# Patient Record
Sex: Female | Born: 1950 | Race: Black or African American | Hispanic: No | Marital: Married | Smoking: Former smoker
Health system: Southern US, Community
[De-identification: ages and names within clinical notes are randomized; demographics above are authoritative.]

## PROBLEM LIST (undated history)

## (undated) DIAGNOSIS — M1990 Unspecified osteoarthritis, unspecified site: Secondary | ICD-10-CM

## (undated) DIAGNOSIS — Z9889 Other specified postprocedural states: Secondary | ICD-10-CM

## (undated) DIAGNOSIS — K219 Gastro-esophageal reflux disease without esophagitis: Secondary | ICD-10-CM

## (undated) DIAGNOSIS — F419 Anxiety disorder, unspecified: Secondary | ICD-10-CM

## (undated) DIAGNOSIS — Z803 Family history of malignant neoplasm of breast: Secondary | ICD-10-CM

## (undated) DIAGNOSIS — E7800 Pure hypercholesterolemia, unspecified: Secondary | ICD-10-CM

## (undated) DIAGNOSIS — I10 Essential (primary) hypertension: Secondary | ICD-10-CM

## (undated) DIAGNOSIS — I719 Aortic aneurysm of unspecified site, without rupture: Secondary | ICD-10-CM

## (undated) HISTORY — PX: ABDOMINAL HYSTERECTOMY: SHX81

## (undated) HISTORY — DX: Family history of malignant neoplasm of breast: Z80.3

## (undated) HISTORY — PX: TONSILLECTOMY: SUR1361

## (undated) HISTORY — DX: Other specified postprocedural states: Z98.890

---

## 2010-10-07 ENCOUNTER — Ambulatory Visit (HOSPITAL_COMMUNITY)
Admission: RE | Admit: 2010-10-07 | Discharge: 2010-10-07 | Payer: Self-pay | Source: Home / Self Care | Attending: Nurse Practitioner | Admitting: Nurse Practitioner

## 2011-03-23 ENCOUNTER — Emergency Department (HOSPITAL_COMMUNITY)
Admission: EM | Admit: 2011-03-23 | Discharge: 2011-03-23 | Disposition: A | Discharge status: Home / Self Care | Payer: Federal, State, Local not specified - PPO | Attending: Emergency Medicine | Admitting: Emergency Medicine

## 2011-03-23 ENCOUNTER — Encounter: Payer: Self-pay | Admitting: *Deleted

## 2011-03-23 DIAGNOSIS — I10 Essential (primary) hypertension: Secondary | ICD-10-CM | POA: Insufficient documentation

## 2011-03-23 DIAGNOSIS — M62838 Other muscle spasm: Secondary | ICD-10-CM | POA: Insufficient documentation

## 2011-03-23 HISTORY — DX: Gastro-esophageal reflux disease without esophagitis: K21.9

## 2011-03-23 HISTORY — DX: Essential (primary) hypertension: I10

## 2011-03-23 LAB — BASIC METABOLIC PANEL
CO2: 25 mEq/L (ref 19–32)
Calcium: 10 mg/dL (ref 8.4–10.5)
Creatinine, Ser: 0.73 mg/dL (ref 0.50–1.10)
GFR calc non Af Amer: >60 mL/min (ref >60)

## 2011-03-23 LAB — PROTIME-INR
INR: 1.08 (ref 0.00–1.49)
Prothrombin Time: 14.2 seconds (ref 11.6–15.2)

## 2011-03-23 LAB — CBC
MCV: 85.1 fL (ref 78.0–100.0)
Platelets: 254 10*3/uL (ref 150–400)
RDW: 13.8 % (ref 11.5–15.5)
WBC: 8.5 10*3/uL (ref 4.0–10.5)

## 2011-03-23 LAB — APTT: aPTT: 30 seconds (ref 24–37)

## 2011-03-23 LAB — D-DIMER, QUANTITATIVE: D-Dimer, Quant: 0.54 ug/mL-FEU — ABNORMAL HIGH (ref 0.00–0.48)

## 2011-03-23 MED ORDER — CYCLOBENZAPRINE HCL 10 MG PO TABS: 10.0000 mg | ORAL_TABLET | Freq: Once | ORAL | Status: AC

## 2011-03-23 MED ORDER — IBUPROFEN 800 MG PO TABS: 800.0000 mg | ORAL_TABLET | Freq: Three times a day (TID) | ORAL | Status: AC

## 2011-03-23 MED ORDER — CYCLOBENZAPRINE HCL 10 MG PO TABS
10.0000 mg | ORAL_TABLET | Freq: Once | ORAL | Status: AC
  Administered 2011-03-23: 10 mg via ORAL
  Filled 2011-03-23: qty 1

## 2011-03-23 MED ORDER — KETOROLAC TROMETHAMINE 60 MG/2ML IM SOLN
60.0000 mg | Freq: Once | INTRAMUSCULAR | Status: AC
  Administered 2011-03-23: 60 mg via INTRAMUSCULAR
  Filled 2011-03-23: qty 2

## 2011-03-23 NOTE — ED Provider Notes (Signed)
History     Chief Complaint  Patient presents with  . Leg Pain   HPI Comments: Pt with hx of RLE pain that started mildly 3 days ago and has been persistent but not worrisome - today she was driving her from [facility] and noted that she developed worsening pain in the R calf which is constant, worse with ambulation and walking on the leg, constant, severe and not associated with swelling.  She has no RF for DVT other than short travel today but she ha been getting out of the car every hour to walk.  No hx of DVT, no smoking, no hormone use, no FHx of DVT.  Denies trauma to leg.  No sob, fever, nausea, cp or other c/o.  The history is provided by the patient and a relative.    Past Medical History  Diagnosis Date  . Hypertension   . GERD (gastroesophageal reflux disease)     Past Surgical History  Procedure Date  . Abdominal hysterectomy   . Tonsillectomy     Family History  Problem Relation Age of Onset  . Cancer Mother     History  Substance Use Topics  . Smoking status: Former Games developer  . Smokeless tobacco: Not on file  . Alcohol Use: No    OB History    Grav Para Term Preterm Abortions TAB SAB Ect Mult Living                  Review of Systems  Constitutional: Negative for fever and chills.  HENT: Negative for sore throat and neck pain.   Eyes: Negative for visual disturbance.  Respiratory: Negative for cough and shortness of breath.   Cardiovascular: Negative for chest pain.  Gastrointestinal: Negative for nausea, vomiting, abdominal pain and diarrhea.  Genitourinary: Negative for dysuria and frequency.  Musculoskeletal: Negative for back pain.       Pain in RLE  Skin: Negative for rash.  Neurological: Negative for weakness, numbness and headaches.  Hematological: Negative for adenopathy.  Psychiatric/Behavioral: Negative for behavioral problems.    Physical Exam  BP 154/91  Pulse 82  Temp(Src) 97.8 F (36.6 C) (Oral)  Resp 20  Ht 5\' 10"  (1.778 m)  Wt  208 lb (94.348 kg)  BMI 29.84 kg/m2  SpO2 98%  Physical Exam  Constitutional: She appears well-developed and well-nourished. No distress.  HENT:  Head: Normocephalic and atraumatic.  Mouth/Throat: Oropharynx is clear and moist. No oropharyngeal exudate.  Eyes: Conjunctivae and EOM are normal. Pupils are equal, round, and reactive to light. Right eye exhibits no discharge. Left eye exhibits no discharge. No scleral icterus.  Neck: Normal range of motion. Neck supple. No JVD present. No thyromegaly present.  Cardiovascular: Normal rate, regular rhythm, normal heart sounds and intact distal pulses.  Exam reveals no gallop and no friction rub.   No murmur heard. Pulmonary/Chest: Effort normal and breath sounds normal.  Abdominal: Soft. There is no tenderness.  Musculoskeletal: Normal range of motion. She exhibits tenderness ( ttp int he posterior calf on the R, pain with dorsiflexion of the R foot, no edema of the legs. normal pulses at the feet bil). She exhibits no edema.  Lymphadenopathy:    She has no cervical adenopathy.  Neurological: She is alert. Coordination normal.  Skin: Skin is warm and dry. No rash noted. She is not diaphoretic. No erythema.  Psychiatric: She has a normal mood and affect. Her behavior is normal.    ED Course  Procedures  MDM  Has pain in the calf, r/o dvt, spasm, low K  K is 3.4, d dimer barely elevated - will have come back in AM for [place].  Well appearing at this time overall.    Pain is improving with meds, scheduled [place] for the AM, will withhold treatment for DVT until [place] result done in AM.  Vida Roller, MD 03/23/11 2014

## 2011-03-23 NOTE — ED Notes (Signed)
MD at bedside. Dr. Miller at bedside.  

## 2011-03-23 NOTE — ED Notes (Signed)
Patient tearful during assessment.  A&O; skin w/d. Respirations even and unlabored; able to speak in complete sentences without difficulty.  Unable to assess gait.

## 2011-03-23 NOTE — ED Notes (Signed)
Pt c/o severe pain in her right calf. States that it has been a little achy for 2-3 days but has gotten extremely bad over the last hour. Hurts to put weight on it. No edema or redness noted.

## 2011-03-24 ENCOUNTER — Ambulatory Visit (HOSPITAL_COMMUNITY)
Admit: 2011-03-24 | Discharge: 2011-03-24 | Disposition: A | Discharge status: Home / Self Care | Payer: Federal, State, Local not specified - PPO | Source: Ambulatory Visit | Attending: Emergency Medicine | Admitting: Emergency Medicine

## 2011-03-24 DIAGNOSIS — M79609 Pain in unspecified limb: Secondary | ICD-10-CM | POA: Insufficient documentation

## 2011-03-24 DIAGNOSIS — M7989 Other specified soft tissue disorders: Secondary | ICD-10-CM | POA: Insufficient documentation

## 2015-06-06 ENCOUNTER — Encounter (HOSPITAL_COMMUNITY): Payer: Self-pay | Admitting: *Deleted

## 2015-06-06 ENCOUNTER — Emergency Department (HOSPITAL_COMMUNITY)
Admission: EM | Admit: 2015-06-06 | Discharge: 2015-06-06 | Disposition: A | Discharge status: Home / Self Care | Payer: Federal, State, Local not specified - PPO | Attending: Emergency Medicine | Admitting: Emergency Medicine

## 2015-06-06 DIAGNOSIS — Z79899 Other long term (current) drug therapy: Secondary | ICD-10-CM | POA: Diagnosis not present

## 2015-06-06 DIAGNOSIS — R21 Rash and other nonspecific skin eruption: Secondary | ICD-10-CM | POA: Diagnosis present

## 2015-06-06 DIAGNOSIS — I10 Essential (primary) hypertension: Secondary | ICD-10-CM | POA: Diagnosis not present

## 2015-06-06 DIAGNOSIS — K219 Gastro-esophageal reflux disease without esophagitis: Secondary | ICD-10-CM | POA: Insufficient documentation

## 2015-06-06 DIAGNOSIS — B029 Zoster without complications: Secondary | ICD-10-CM | POA: Diagnosis not present

## 2015-06-06 DIAGNOSIS — Z87891 Personal history of nicotine dependence: Secondary | ICD-10-CM | POA: Diagnosis not present

## 2015-06-06 MED ORDER — VALACYCLOVIR HCL 1 G PO TABS: 1000.0000 mg | ORAL_TABLET | Freq: Three times a day (TID) | ORAL | Status: AC

## 2015-06-06 MED ORDER — HYDROXYZINE HCL 25 MG PO TABS: 25.0000 mg | ORAL_TABLET | Freq: Four times a day (QID) | ORAL | Status: DC

## 2015-06-06 MED ORDER — HYDROCODONE-ACETAMINOPHEN 5-325 MG PO TABS: 2.0000 | ORAL_TABLET | ORAL | Status: DC | PRN

## 2015-06-06 NOTE — ED Notes (Signed)
Rash to left breast area first noticed yesterday evening. Pt first noticed a "prickling pain" to the area.

## 2015-06-06 NOTE — ED Provider Notes (Signed)
CSN: [id number]     Arrival date & time 06/06/15  6644 History   First MD Initiated Contact with Patient 06/06/15 1014     Chief Complaint  Patient presents with  . Rash     (Consider location/radiation/quality/duration/timing/severity/associated sxs/prior Treatment) Patient is a 64 y.o. female presenting with rash. The history is provided by the patient. No language interpreter was used.  Rash Location:  Torso Torso rash location:  L chest Quality: blistering and itchiness   Severity:  Moderate Onset quality:  Gradual Duration:  1 day Timing:  Constant Progression:  Worsening Chronicity:  New Relieved by:  Nothing Worsened by:  Nothing tried Ineffective treatments:  None tried Associated symptoms: no fever, no joint pain and no nausea     Past Medical History  Diagnosis Date  . Hypertension   . GERD (gastroesophageal reflux disease)    Past Surgical History  Procedure Laterality Date  . Abdominal hysterectomy    . Tonsillectomy     Family History  Problem Relation Age of Onset  . Cancer Mother    Social History  Substance Use Topics  . Smoking status: Former Games developer  . Smokeless tobacco: None  . Alcohol Use: No   OB History    No data available     Review of Systems  Constitutional: Negative for fever.  Gastrointestinal: Negative for nausea.  Musculoskeletal: Negative for arthralgias.  Skin: Positive for rash.  All other systems reviewed and are negative.     Allergies  Review of patient's allergies indicates no known allergies.  Home Medications   Prior to Admission medications   Medication Sig Start Date End Date Taking? Authorizing Provider  losartan-hydrochlorothiazide (HYZAAR) 50-12.5 MG per tablet Take 0.5 tablets by mouth daily.      Historical Provider, MD  ranitidine (ZANTAC) 150 MG capsule Take 150 mg by mouth as needed.      Historical Provider, MD   BP 152/88 mmHg  Pulse 70  Temp(Src) 97.8 F (36.6 C) (Oral)  Resp 16  Ht   (1.753 m)  Wt 208 lb (94.348 kg)  BMI 30.70 kg/m2  SpO2 97% Physical Exam  Constitutional: She is oriented to person, place, and time. She appears well-developed and well-nourished.  HENT:  Head: Normocephalic and atraumatic.  Right Ear: External ear normal.  Nose: Nose normal.  Mouth/Throat: Oropharynx is clear and moist.  Eyes: Conjunctivae and EOM are normal. Pupils are equal, round, and reactive to light.  Neck: Normal range of motion.  Cardiovascular: Normal rate and normal heart sounds.   Pulmonary/Chest: Effort normal.  Abdominal: Soft. She exhibits no distension.  Musculoskeletal: Normal range of motion.  Neurological: She is alert and oriented to person, place, and time.  Skin: Rash noted.  Erythematous raised pimples,   Left side only,  May be early shingles  Psychiatric: She has a normal mood and affect.  Nursing note and vitals reviewed.   ED Course  Procedures (including critical care time) Labs Review Labs Reviewed - No data to display  Imaging Review No results found. I have personally reviewed and evaluated these images and lab results as part of my medical decision-making.   EKG Interpretation None      MDM   Final diagnoses:  Shingles    Valtrex Atarax Hydrocodone Return if any problems.   Lonia Skinner [place], PA-C 06/06/15 1043  Zadie Rhine, MD 06/06/15 [phone]

## 2015-06-06 NOTE — Discharge Instructions (Signed)

## 2015-12-29 ENCOUNTER — Ambulatory Visit: Payer: Federal, State, Local not specified - PPO | Admitting: Orthopaedic Surgery

## 2015-12-30 ENCOUNTER — Encounter: Payer: Self-pay | Admitting: Orthopaedic Surgery

## 2015-12-30 ENCOUNTER — Ambulatory Visit (INDEPENDENT_AMBULATORY_CARE_PROVIDER_SITE_OTHER): Payer: Medicare Other | Admitting: Orthopaedic Surgery

## 2015-12-30 VITALS — BP 133/86 | HR 79 | Temp 97.5°F | Resp 16 | Ht 69.0 in | Wt 208.0 lb

## 2015-12-30 DIAGNOSIS — M79672 Pain in left foot: Secondary | ICD-10-CM | POA: Diagnosis not present

## 2015-12-30 DIAGNOSIS — M25561 Pain in right knee: Secondary | ICD-10-CM | POA: Diagnosis not present

## 2015-12-30 NOTE — Patient Instructions (Signed)
Continue to be active, use ibuprofen, and ice

## 2015-12-30 NOTE — Progress Notes (Signed)
Subjective: My right knee is popping but it does not hurt, my left heel hurts some.    Patient ID: Mariah Holt, female    DOB: 05-29-1951, 65 y.o.   MRN: [id number]  Knee Pain  The incident occurred more than 1 week ago. There was no injury mechanism. The pain is present in the right knee. The pain is at a severity of 0/10. The patient is experiencing no pain. Pertinent negatives include no inability to bear weight, loss of motion, loss of sensation, muscle weakness, numbness or tingling. Nothing aggravates the symptoms. She has tried nothing for the symptoms.  Foot Pain This is a chronic problem. The problem has been gradually improving. Associated symptoms include arthralgias. Pertinent negatives include no chest pain, congestion, coughing or numbness. The symptoms are aggravated by walking. She has tried NSAIDs for the symptoms. The treatment provided significant relief.   She has had left heel pain on and off for several months.  She has been doing stretching exercises on a regular basis.  It has really helped.  It is not hurting today.  She has had some popping of the right knee recently.  She has no pain, no swelling, no redness, no locking, no trauma.  She is concerned about the popping.  She goes to the gym three days a week and is very active.  She has no other joint pains.   Review of Systems  HENT: Negative for congestion.   Respiratory: Negative for cough and shortness of breath.   Cardiovascular: Negative for chest pain and leg swelling.  Endocrine: Negative for cold intolerance.  Musculoskeletal: Positive for arthralgias.  Allergic/Immunologic: Negative for environmental allergies.  Neurological: Negative for tingling and numbness.  All other systems reviewed and are negative.  Past Medical History  Diagnosis Date  . Hypertension   . GERD (gastroesophageal reflux disease)     Past Surgical History  Procedure Laterality Date  . Abdominal hysterectomy    .  Tonsillectomy      Current Outpatient Prescriptions on File Prior to Visit  Medication Sig Dispense Refill  . losartan-hydrochlorothiazide (HYZAAR) 50-12.5 MG per tablet Take 0.5 tablets by mouth daily.       No current facility-administered medications on file prior to visit.    Social History   Social History  . Marital Status: Divorced    Spouse Name: N/A  . Number of Children: N/A  . Years of Education: N/A   Occupational History  . Not on file.   Social History Main Topics  . Smoking status: Former Games developermoker  . Smokeless tobacco: Not on file  . Alcohol Use: No  . Drug Use: No  . Sexual Activity: Not on file   Other Topics Concern  . Not on file   Social History Narrative    BP 133/86 mmHg  Pulse 79  Temp(Src) 97.5 F (36.4 C)  Resp 16  Ht 5\' 9"  (1.753 m)  Wt 208 lb (94.348 kg)  BMI 30.70 kg/m2      Objective:   Physical Exam  Constitutional: She is oriented to person, place, and time. She appears well-developed and well-nourished.  HENT:  Head: Normocephalic and atraumatic.  Eyes: Conjunctivae and EOM are normal. Pupils are equal, round, and reactive to light.  Neck: Normal range of motion. Neck supple.  Cardiovascular: Normal rate, regular rhythm and intact distal pulses.   Pulmonary/Chest: Effort normal.  Abdominal: Soft.  Musculoskeletal: Normal range of motion.  Neurological: She is alert and oriented to person,  place, and time. She displays normal reflexes. No cranial nerve deficit. She exhibits normal muscle tone. Coordination normal.  Skin: Skin is warm and dry.  Psychiatric: She has a normal mood and affect. Her behavior is normal. Judgment and thought content normal.   Exam is normal      Assessment & Plan:  She has history of left heel pain not hurting now.  She has history of right knee crepitus, no pain.  I have explained findings and I have told her that just having popping with no pain of the knee is not a major worry.  She may have  early signs of arthritis.  She is to continue her activities.  I will see her as needed.

## 2018-05-25 ENCOUNTER — Emergency Department (HOSPITAL_COMMUNITY)
Admission: EM | Admit: 2018-05-25 | Discharge: 2018-05-25 | Disposition: A | Discharge status: Home / Self Care | Payer: Medicare Other | Attending: Emergency Medicine | Admitting: Emergency Medicine

## 2018-05-25 ENCOUNTER — Encounter (HOSPITAL_COMMUNITY): Payer: Self-pay | Admitting: Emergency Medicine

## 2018-05-25 ENCOUNTER — Other Ambulatory Visit: Payer: Self-pay

## 2018-05-25 DIAGNOSIS — E7800 Pure hypercholesterolemia, unspecified: Secondary | ICD-10-CM | POA: Insufficient documentation

## 2018-05-25 DIAGNOSIS — I10 Essential (primary) hypertension: Secondary | ICD-10-CM

## 2018-05-25 DIAGNOSIS — R42 Dizziness and giddiness: Secondary | ICD-10-CM

## 2018-05-25 DIAGNOSIS — Z87891 Personal history of nicotine dependence: Secondary | ICD-10-CM | POA: Diagnosis not present

## 2018-05-25 DIAGNOSIS — Z79899 Other long term (current) drug therapy: Secondary | ICD-10-CM | POA: Insufficient documentation

## 2018-05-25 HISTORY — DX: Pure hypercholesterolemia, unspecified: E78.00

## 2018-05-25 LAB — I-STAT CHEM 8, ED
BUN: 12 mg/dL (ref 8–23)
CREATININE: 0.7 mg/dL (ref 0.44–1.00)
Calcium, Ion: 1.16 mmol/L (ref 1.15–1.40)
Chloride: 105 mmol/L (ref 98–111)
Glucose, Bld: 101 mg/dL — ABNORMAL HIGH (ref 70–99)
HEMATOCRIT: 41 % (ref 36.0–46.0)
HEMOGLOBIN: 13.9 g/dL (ref 12.0–15.0)
POTASSIUM: 3.8 mmol/L (ref 3.5–5.1)
SODIUM: 139 mmol/L (ref 135–145)
TCO2: 23 mmol/L (ref 22–32)

## 2018-05-25 NOTE — Discharge Instructions (Signed)
Your testing today has been reassuring, your blood work was normal, your EKG was normal showing no signs of heart problems, your blood pressure has been slightly elevated compared to your normal.  Please take an extra dose of your home blood pressure medication today at this time, tomorrow resume your normal daily medication dosing.  Please take your blood pressure daily at the same time every day, 2 hours after you take your blood pressure medication, keep a record of this and share this with your family doctor.  Drink plenty of fluids and rest today staying out of the heat and do not exert yourself for the rest of the day.  Return to the emergency department for severe or worsening symptoms including chest pain, headache, blurred vision, weakness or numbness.

## 2018-05-25 NOTE — ED Provider Notes (Signed)
[facility] EMERGENCY DEPARTMENT Provider Note   CSN: [id number] Arrival date & time: 05/25/18  1054     History   Chief Complaint Chief Complaint  Patient presents with  . Hypertension    HPI Bess KindsFrancine Mcerlean is a 67 y.o. female.  HPI  The patient is a 67 year old female, she has a known history of acid reflux, high cholesterol, hypertension for which she takes an angiotensin receptor blocker as well and has a diuretic hydrochlorothiazide.  She reports that she was in her usual state of health until a couple weeks ago when she developed a gastrointestinal illness with lots of diarrhea, shortly thereafter she was scheduled for colonoscopy and did the colonoscopy prep further having more diarrhea, colonoscopy was unremarkable with a single polyp but no complications, endoscopy at the same time was also reassuring, she then went back to the gym and started exercising.  For the last couple of days she has had a decreased appetite but has been taking both food and water, diarrhea has resolved and she has been doing well.  Today she was outside gardening, bending over picking weeds and putting out mulch, she saw her husband coming across the yard and stood up quickly to say hello when at that time she became lightheaded feeling like she was going to pass out.  This came on abruptly with her change in position and resolved after less than 1 minute.  She went inside and took her blood pressure which was elevated at 158/100, she took it several times and it remained elevated which raise concern for her.  She has never had high blood pressure like that in the past.  She denies fevers chills coughing shortness of breath chest pain headache blurred vision numbness or weakness at this time.  States that she is in her usual state of health at this time.  Past Medical History:  Diagnosis Date  . GERD (gastroesophageal reflux disease)   . High cholesterol   . Hypertension     There are no active  problems to display for this patient.   Past Surgical History:  Procedure Laterality Date  . ABDOMINAL HYSTERECTOMY    . TONSILLECTOMY       OB History   None      Home Medications    Prior to Admission medications   Medication Sig Start Date End Date Taking? Authorizing Provider  losartan-hydrochlorothiazide (HYZAAR) 50-12.5 MG per tablet Take 0.5 tablets by mouth daily.      [provider]  omeprazole (PRILOSEC) 10 MG capsule Take 10 mg by mouth daily.    [provider]  Vitamin D, Ergocalciferol, (DRISDOL) 50000 units CAPS capsule Take 50,000 Units by mouth every 7 (seven) days.    [provider]    Family History Family History  Problem Relation Age of Onset  . Cancer Mother     Social History Social History   Tobacco Use  . Smoking status: Former Games developermoker  . Smokeless tobacco: Never Used  Substance Use Topics  . Alcohol use: Yes    Comment: rarely  . Drug use: No     Allergies   Patient has no known allergies.   Review of Systems Review of Systems  All other systems reviewed and are negative.    Physical Exam Updated Vital Signs BP (!) 162/84 (BP Location: Right Arm)   Pulse 70   Temp 98.6 F (37 C) (Oral)   Resp 16   Ht 1.753 m (5\' 9" )  Wt 95.3 kg   SpO2 95%   BMI 31.01 kg/m   Physical Exam  Constitutional: She appears well-developed and well-nourished. No distress.  HENT:  Head: Normocephalic and atraumatic.  Mouth/Throat: Oropharynx is clear and moist. No oropharyngeal exudate.  Eyes: Pupils are equal, round, and reactive to light. Conjunctivae and EOM are normal. Right eye exhibits no discharge. Left eye exhibits no discharge. No scleral icterus.  Neck: Normal range of motion. Neck supple. No JVD present. No thyromegaly present.  Cardiovascular: Normal rate, regular rhythm, normal heart sounds and intact distal pulses. Exam reveals no gallop and no friction rub.  No murmur heard. Pulmonary/Chest: Effort  normal and breath sounds normal. No respiratory distress. She has no wheezes. She has no rales.  Abdominal: Soft. Bowel sounds are normal. She exhibits no distension and no mass. There is no tenderness.  Musculoskeletal: Normal range of motion. She exhibits no edema or tenderness.  Lymphadenopathy:    She has no cervical adenopathy.  Neurological: She is alert. Coordination normal.  Normal speech, coordination, gait, strength in all 4 extremities, no facial droop, the patient does have a stutter which she states is normal for her  Skin: Skin is warm and dry. No rash noted. No erythema.  Psychiatric: She has a normal mood and affect. Her behavior is normal.  Nursing note and vitals reviewed.    ED Treatments / Results  Labs (all labs ordered are listed, but only abnormal results are displayed) Labs Reviewed  I-STAT CHEM 8, ED - Abnormal; Notable for the following components:      Result Value   Glucose, Bld 101 (*)    All other components within normal limits    EKG EKG Interpretation  Date/Time:  Friday May 25 2018 12:21:16 EDT Ventricular Rate:  64 PR Interval:    QRS Duration: 94 QT Interval:  411 QTC Calculation: 424 R Axis:   21 Text Interpretation:  Sinus rhythm Baseline wander in lead(s) V5 ECG OTHERWISE WITHIN NORMAL LIMITS No old tracing to compare Confirmed by Eber Hong ([id number]) on 05/25/2018 12:24:10 PM   Radiology No results found.  Procedures Procedures (including critical care time)  Medications Ordered in ED Medications - No data to display   Initial Impression / Assessment and Plan / ED Course  I have reviewed the triage vital signs and the nursing notes.  Pertinent labs & imaging results that were available during my care of the patient were reviewed by me and considered in my medical decision making (see chart for details).  Clinical Course as of May 25 1321  Fri May 25, 2018  1320 Electrolytes, renal function, potassium are all normal.   Blood pressure rechecked is 162/84   [BM]    Clinical Course User Index [BM] Eber Hong, MD    Obtain EKG, i-STAT chemistry to evaluate potassium, urinalysis at the bedside is very clear without any signs of dehydration or hyper concentration.  Patient is well-appearing, likely orthostatic changes from relative dehydration.  Pt agreeable to d/c Labs normal BP slightly elevated -  Will record daily BP and share with PCP  Final Clinical Impressions(s) / ED Diagnoses   Final diagnoses:  Essential hypertension  Light headed    ED Discharge Orders    None       Eber Hong, MD 05/25/18 1322

## 2018-05-25 NOTE — ED Triage Notes (Signed)
Patient states she was bent over this morning and straightened up, then felt lightheaded. States she took her blood pressure and it was 158/100. Patient denies any complaints at this time.

## 2019-03-27 NOTE — Patient Instructions (Signed)
I value your feedback and entrusting us with your care. If you get a [facility] patient survey, I would appreciate you taking the time to let us know about your experience today. Thank you! 

## 2019-03-27 NOTE — Progress Notes (Signed)
System, Provider Not In   Chief Complaint  Patient presents with  . Ovarian Pain    left side only, pain runs to lower back for the past 2 days    HPI:      [name] is a 68 y.o. No obstetric history on file. who LMP was No LMP recorded. Patient has had a hysterectomy., presents today for NP eval of LLQ pain for the past 2 days, radiating to low back. Pain was really bad when it started--sharp, achy, constant, 8 out of 10 on pain scale. Pt couldn't sleep or get comfortable. Sx slightly better today at a 5 out of 10. No aggrav/allev factors. Tried tylenol without relief. No recent hip injury, back injury, trauma. Did hurt to cross left leg over to right leg but better today. No hx of ovar issues. S/p TAH 27 yrs ago for leio/DUB. No GI sx, vag sx, PMB. No fevers, urin sx. Noted a pelvic "sensation" about 2 wks ago with PCP and had neg UA. No sx until pain started 2 days ago.  FH breast cancer in her mom.    Past Medical History:  Diagnosis Date  . Family history of breast cancer   . GERD (gastroesophageal reflux disease)   . H/O benign breast biopsy   . High cholesterol   . Hypertension     Past Surgical History:  Procedure Laterality Date  . ABDOMINAL HYSTERECTOMY     due to leio/DUB  . TONSILLECTOMY      Family History  Problem Relation Age of Onset  . Breast cancer Mother 31    Social History   Socioeconomic History  . Marital status: Divorced    Spouse name: Not on file  . Number of children: Not on file  . Years of education: Not on file  . Highest education level: Not on file  Occupational History  . Not on file  Social Needs  . Financial resource strain: Not on file  . Food insecurity    Worry: Not on file    Inability: Not on file  . Transportation needs    Medical: Not on file    Non-medical: Not on file  Tobacco Use  . Smoking status: Former Research scientist (life sciences)  . Smokeless tobacco: Never Used  Substance and Sexual Activity  . Alcohol use: Yes    Comment: rarely  . Drug use: No  . Sexual activity: Yes    Birth control/protection: Surgical    Comment: Hysterectomy  Lifestyle  . Physical activity    Days per week: Not on file    Minutes per session: Not on file  . Stress: Not on file  Relationships  . Social Herbalist on phone: Not on file    Gets together: Not on file    Attends religious service: Not on file    Active member of club or organization: Not on file    Attends meetings of clubs or organizations: Not on file    Relationship status: Not on file  . Intimate partner violence    Fear of current or ex partner: Not on file    Emotionally abused: Not on file    Physically abused: Not on file    Forced sexual activity: Not on file  Other Topics Concern  . Not on file  Social History Narrative  . Not on file    Outpatient Medications Prior to Visit  Medication Sig Dispense Refill  . ALPRAZolam (XANAX) 0.25  MG tablet TK 1 T PO QD PRN    . hydrochlorothiazide (HYDRODIURIL) 12.5 MG tablet TK 1 T PO  QD ALONG WITH LOSARTAN    . losartan (COZAAR) 50 MG tablet TK 1 T PO QD ALONG WITH HYDROCHLOROTHIAZIDE    . losartan-hydrochlorothiazide (HYZAAR) 50-12.5 MG per tablet Take 0.5 tablets by mouth daily.      . pravastatin (PRAVACHOL) 20 MG tablet TK 1/2 T PO BID    . Vitamin D, Ergocalciferol, (DRISDOL) 50000 units CAPS capsule Take 50,000 Units by mouth every 7 (seven) days.    Marland Kitchen. omeprazole (PRILOSEC) 10 MG capsule Take 10 mg by mouth daily.     No facility-administered medications prior to visit.       ROS:  Review of Systems  Constitutional: Negative for fatigue, fever and unexpected weight change.  Respiratory: Negative for cough, shortness of breath and wheezing.   Cardiovascular: Negative for chest pain, palpitations and leg swelling.  Gastrointestinal: Negative for blood in stool, constipation, diarrhea, nausea and vomiting.  Endocrine: Negative for cold intolerance, heat intolerance and polyuria.   Genitourinary: Positive for pelvic pain. Negative for dyspareunia, dysuria, flank pain, frequency, genital sores, hematuria, menstrual problem, urgency, vaginal bleeding, vaginal discharge and vaginal pain.  Musculoskeletal: Positive for back pain. Negative for joint swelling and myalgias.  Skin: Negative for rash.  Neurological: Negative for dizziness, syncope, light-headedness, numbness and headaches.  Hematological: Negative for adenopathy.  Psychiatric/Behavioral: Negative for agitation, confusion, sleep disturbance and suicidal ideas. The patient is not nervous/anxious.     OBJECTIVE:   Vitals:  BP (!) 142/92   Ht 5\' 9"  (1.753 m)   Wt 219 lb (99.3 kg)   BMI 32.34 kg/m   Physical Exam Vitals signs reviewed.  Constitutional:      Appearance: She is well-developed.  Neck:     Musculoskeletal: Normal range of motion.  Pulmonary:     Effort: Pulmonary effort is normal.  Abdominal:     Palpations: Abdomen is soft.     Tenderness: There is no abdominal tenderness.    Genitourinary:    General: Normal vulva.     Pubic Area: No rash.      Labia:        Right: No rash, tenderness or lesion.        Left: No rash, tenderness or lesion.      Vagina: Normal. No vaginal discharge, erythema or tenderness.     Uterus: Absent. Not tender.      Adnexa: Right adnexa normal and left adnexa normal.       Right: No mass or tenderness.         Left: No mass or tenderness.    Musculoskeletal: Normal range of motion.  Skin:    General: Skin is warm and dry.  Neurological:     General: No focal deficit present.     Mental Status: She is alert and oriented to person, place, and time.  Psychiatric:        Mood and Affect: Mood normal.        Behavior: Behavior normal.        Thought Content: Thought content normal.        Judgment: Judgment normal.     Results:  ULTRASOUND REPORT  Location: [facility]  Date of Service: 03/28/2019    Indications:Pelvic Pain Findings:    The uterus is absent  Right Ovary measures 1.8 x 0.8 x 0.8 cm. It is normal in appearance. Left Ovary measures 3.2  x 1.9 x 2.3 cm. It is normal in appearance. Survey of the adnexa demonstrates no adnexal masses. There is no free fluid in the cul de sac.  Impression: 1. The uterus and cervix are absent 2. The right ovary is questionably visualized near the vaginal cuff 3. Normal left ovary   Recommendations: 1.Clinical correlation with the patient's History and Physical Exam.   Deanna ArtisElyse S Fairbanks, RT  Assessment/Plan: LLQ pain - Plan: US PELVIS TRANSVAGINAL NON-OB (TV ONLY), Neg exam, neg GYN u/s. Question MSK given sx hx and improvement today. Cont to follow expectantly. F/u with PCP if sx persist/worsen.     Return if symptoms worsen or fail to improve.  Natausha Jungwirth B. Dandrea Widdowson, PA-C 03/28/2019 5:05 PM

## 2019-03-28 ENCOUNTER — Encounter: Payer: Self-pay | Admitting: Obstetrics and Gynecology

## 2019-03-28 ENCOUNTER — Ambulatory Visit (INDEPENDENT_AMBULATORY_CARE_PROVIDER_SITE_OTHER): Payer: Medicare Other | Admitting: Obstetrics and Gynecology

## 2019-03-28 ENCOUNTER — Other Ambulatory Visit (INDEPENDENT_AMBULATORY_CARE_PROVIDER_SITE_OTHER): Payer: Medicare Other

## 2019-03-28 ENCOUNTER — Other Ambulatory Visit: Payer: Self-pay

## 2019-03-28 VITALS — BP 142/92 | Ht 69.0 in | Wt 219.0 lb

## 2019-03-28 DIAGNOSIS — R1032 Left lower quadrant pain: Secondary | ICD-10-CM

## 2019-04-06 ENCOUNTER — Encounter (HOSPITAL_COMMUNITY): Payer: Self-pay | Admitting: Emergency Medicine

## 2019-04-06 ENCOUNTER — Other Ambulatory Visit: Payer: Self-pay

## 2019-04-06 ENCOUNTER — Emergency Department (HOSPITAL_COMMUNITY)
Admission: EM | Admit: 2019-04-06 | Discharge: 2019-04-06 | Disposition: A | Discharge status: Home / Self Care | Payer: Medicare Other | Attending: Emergency Medicine | Admitting: Emergency Medicine

## 2019-04-06 ENCOUNTER — Telehealth: Payer: Medicare Other | Admitting: Physician Assistant

## 2019-04-06 DIAGNOSIS — Z5321 Procedure and treatment not carried out due to patient leaving prior to being seen by health care provider: Secondary | ICD-10-CM | POA: Diagnosis not present

## 2019-04-06 DIAGNOSIS — M542 Cervicalgia: Secondary | ICD-10-CM | POA: Diagnosis present

## 2019-04-06 DIAGNOSIS — M436 Torticollis: Secondary | ICD-10-CM

## 2019-04-06 DIAGNOSIS — J019 Acute sinusitis, unspecified: Secondary | ICD-10-CM

## 2019-04-06 DIAGNOSIS — B9789 Other viral agents as the cause of diseases classified elsewhere: Secondary | ICD-10-CM

## 2019-04-06 DIAGNOSIS — S161XXA Strain of muscle, fascia and tendon at neck level, initial encounter: Secondary | ICD-10-CM

## 2019-04-06 MED ORDER — IPRATROPIUM BROMIDE 0.03 % NA SOLN: 2.0000 | Freq: Two times a day (BID) | NASAL | 0 refills | Status: DC

## 2019-04-06 MED ORDER — MELOXICAM 7.5 MG PO TABS: 7.5000 mg | ORAL_TABLET | Freq: Two times a day (BID) | ORAL | 0 refills | Status: AC | PRN

## 2019-04-06 MED ORDER — METHOCARBAMOL 500 MG PO TABS: 500.0000 mg | ORAL_TABLET | Freq: Two times a day (BID) | ORAL | 0 refills | Status: DC | PRN

## 2019-04-06 NOTE — Progress Notes (Signed)
I have spent 5 minutes in review of e-visit questionnaire, review and updating patient chart, medical decision making and response to patient.   Calah Gershman Cody Emara Lichter, PA-C    

## 2019-04-06 NOTE — ED Triage Notes (Signed)
Patient seen via e-visit today and told she has a sinus infection. Patient states that on Thursday she starting having neck pain on the right side that was causing her to be unable to turn her neck to the right. She states pain in her head that started today as well.

## 2019-04-06 NOTE — Discharge Instructions (Signed)
You may take Mobic, 7.5 mg twice a day as needed for pain, avoid using ibuprofen or Aleve if you are taking this medicine.  You should also take Robaxin, 1 tablet every 8 hours as needed for muscle spasm or tightness.  Alternating hot and cold compresses may be helpful.  Please rest your neck and arms.

## 2019-04-06 NOTE — Progress Notes (Signed)

## 2019-04-06 NOTE — ED Provider Notes (Signed)
[facility] EMERGENCY DEPARTMENT Provider Note   CSN: [id number] Arrival date & time: 04/06/19  1533     History   Chief Complaint Chief Complaint  Patient presents with  . Neck Pain    HPI Mariah Holt is a 68 y.o. female.     HPI  This patient is a very pleasant 68 year old female, she has a known history of acid reflux, high cholesterol, hypertension and presents with pain in the neck on the right side.  She reports that after lifting weights a couple of weeks ago including placing a weight behind her head and neck and doing tricep extensions she started to have pain across her shoulders.  This has been intermittent.  A couple of days ago after playing in the yard with her young grandchildren she noticed the pain going across the back of both of her shoulders and up into her neck.  She took some ibuprofen and has been using both heat and ice which has temporarily relieved her pain.  Today the pain is gone on the left but is present on the right from the shoulder into the trapezius and up onto the neck and the back of the scalp.  She denies any neurologic symptoms including numbness or weakness of the arm, discoordination, changes in speech or vision.  She is right-hand dominant and not having any difficulty using her arm.  She is unable to turn her head from side to side stating that it causes reproducible pain down the right side and is very tight.  Past Medical History:  Diagnosis Date  . Family history of breast cancer   . GERD (gastroesophageal reflux disease)   . H/O benign breast biopsy   . High cholesterol   . Hypertension     There are no active problems to display for this patient.   Past Surgical History:  Procedure Laterality Date  . ABDOMINAL HYSTERECTOMY     due to leio/DUB  . TONSILLECTOMY       OB History    Gravida  4   Para  2   Term  2   Preterm      AB  2   Living  2     SAB  2   TAB      Ectopic      Multiple      Live  Births  2            Home Medications    Prior to Admission medications   Medication Sig Start Date End Date Taking? Authorizing Provider  ALPRAZolam (XANAX) 0.25 MG tablet TK 1 T PO QD PRN 11/28/18   [provider]  hydrochlorothiazide (HYDRODIURIL) 12.5 MG tablet TK 1 T PO  QD ALONG WITH LOSARTAN 11/18/18   [provider]  ipratropium (ATROVENT) 0.03 % nasal spray Place 2 sprays into both nostrils every 12 (twelve) hours. 04/06/19   Brunetta Jeans, PA-C  losartan (COZAAR) 50 MG tablet TK 1 T PO QD ALONG WITH HYDROCHLOROTHIAZIDE 11/19/18   [provider]  losartan-hydrochlorothiazide (HYZAAR) 50-12.5 MG per tablet Take 0.5 tablets by mouth daily.      [provider]  pravastatin (PRAVACHOL) 20 MG tablet TK 1/2 T PO BID 03/19/19   [provider]  Vitamin D, Ergocalciferol, (DRISDOL) 50000 units CAPS capsule Take 50,000 Units by mouth every 7 (seven) days.    [provider]    Family History Family History  Problem Relation Age of Onset  .  Breast cancer Mother 5739    Social History Social History   Tobacco Use  . Smoking status: Former Games developermoker  . Smokeless tobacco: Never Used  Substance Use Topics  . Alcohol use: Yes    Comment: rarely  . Drug use: No     Allergies   Patient has no known allergies.   Review of Systems Review of Systems  Constitutional: Negative for fever.  Eyes: Negative for visual disturbance.  Cardiovascular: Negative for chest pain.  Musculoskeletal: Positive for myalgias and neck pain.  Neurological: Positive for headaches. Negative for speech difficulty, weakness and numbness.     Physical Exam Updated Vital Signs BP (!) 160/86 (BP Location: Left Arm)   Pulse (!) 106   Temp 98.5 F (36.9 C) (Oral)   Resp 14   Ht 1.753 m (5\' 9" )   Wt 96.2 kg   SpO2 98%   BMI 31.31 kg/m   Physical Exam Vitals signs and nursing note reviewed.  Constitutional:      Appearance: She is  well-developed. She is not diaphoretic.  HENT:     Head: Normocephalic and atraumatic.     Comments: No trismus. Eyes:     General:        Right eye: No discharge.        Left eye: No discharge.     Conjunctiva/sclera: Conjunctivae normal.  Pulmonary:     Effort: Pulmonary effort is normal. No respiratory distress.  Musculoskeletal:     Comments: Reproducible tenderness present in the right trapezius and onto the strap muscles of the neck as well as the paraspinal muscles on the right side of the neck.  There is no tenderness over the cervical or thoracic spine.  The patient is able to fully range of motion bilateral upper extremities.  She has some pain with shrugging of the shoulders.  She has limited range of motion of the neck secondary to tenderness on the right lateral muscles.  She is able to flex and extend the neck without difficulty.  Skin:    General: Skin is warm and dry.     Findings: No erythema or rash.  Neurological:     Mental Status: She is alert.     Coordination: Coordination normal.     Comments: Normal gait, normal strength and sensation of the bilateral upper extremities, normal speech, normal facial symmetry, cranial nerves III through XII normal      ED Treatments / Results  Labs (all labs ordered are listed, but only abnormal results are displayed) Labs Reviewed - No data to display  EKG None  Radiology No results found.  Procedures Procedures (including critical care time)  Medications Ordered in ED Medications - No data to display   Initial Impression / Assessment and Plan / ED Course  I have reviewed the triage vital signs and the nursing notes.  Pertinent labs & imaging results that were available during my care of the patient were reviewed by me and considered in my medical decision making (see chart for details).        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  She expressed understanding.  Final Clinical Impressions(s) / ED Diagnoses   Final diagnoses:  None    ED Discharge Orders  None       Eber HongMiller, Aziza Stuckert, MD 04/10/19 [phone]

## 2019-05-14 ENCOUNTER — Other Ambulatory Visit: Payer: Self-pay | Admitting: Physician Assistant

## 2019-05-14 DIAGNOSIS — B9789 Other viral agents as the cause of diseases classified elsewhere: Secondary | ICD-10-CM

## 2019-05-14 DIAGNOSIS — J019 Acute sinusitis, unspecified: Secondary | ICD-10-CM

## 2019-10-11 ENCOUNTER — Ambulatory Visit: Payer: Medicare Other

## 2019-10-19 ENCOUNTER — Ambulatory Visit: Payer: Medicare Other | Attending: Internal Medicine

## 2019-10-19 DIAGNOSIS — Z23 Encounter for immunization: Secondary | ICD-10-CM | POA: Insufficient documentation

## 2019-10-19 NOTE — Progress Notes (Signed)
   Covid-19 Vaccination Clinic  Name:  Lalla Laham    MRN: [id number] DOB: 29-Jul-1951  10/19/2019  Ms. Redditt was observed post Covid-19 immunization for 15 minutes without incidence. She was provided with Vaccine Information Sheet and instruction to access the V-Safe system.   Ms. Caskey was instructed to call 911 with any severe reactions post vaccine: Marland Kitchen Difficulty breathing  . Swelling of your face and throat  . A fast heartbeat  . A bad rash all over your body  . Dizziness and weakness    Immunizations Administered    Name Date Dose VIS Date Route   Pfizer COVID-19 Vaccine 10/19/2019  3:12 PM 0.3 mL 08/23/2019 Intramuscular   Manufacturer: [employer], [employer]   Lot: [id number]   NDC: [address]-2

## 2019-11-01 ENCOUNTER — Ambulatory Visit: Payer: Medicare Other

## 2019-11-13 ENCOUNTER — Ambulatory Visit: Payer: Medicare Other | Attending: Internal Medicine

## 2019-11-13 DIAGNOSIS — Z23 Encounter for immunization: Secondary | ICD-10-CM | POA: Insufficient documentation

## 2019-11-13 NOTE — Progress Notes (Signed)
   Covid-19 Vaccination Clinic  Name:  Mariah Holt    MRN: [id number] DOB: April 22, 1951  11/13/2019  Mariah Holt was observed post Covid-19 immunization for 15 minutes without incident. She was provided with Vaccine Information Sheet and instruction to access the V-Safe system.   Mariah Holt was instructed to call 911 with any severe reactions post vaccine: Marland Kitchen Difficulty breathing  . Swelling of face and throat  . A fast heartbeat  . A bad rash all over body  . Dizziness and weakness   Immunizations Administered    Name Date Dose VIS Date Route   Pfizer COVID-19 Vaccine 11/13/2019 11:30 AM 0.3 mL 08/23/2019 Intramuscular   Manufacturer: [employer], [employer]   Lot: [id number]   NDC: [address]-6

## 2020-11-18 ENCOUNTER — Other Ambulatory Visit: Payer: Self-pay

## 2020-11-18 ENCOUNTER — Ambulatory Visit (INDEPENDENT_AMBULATORY_CARE_PROVIDER_SITE_OTHER): Payer: Medicare Other

## 2020-11-18 ENCOUNTER — Ambulatory Visit
Admission: RE | Admit: 2020-11-18 | Discharge: 2020-11-18 | Disposition: A | Discharge status: Home / Self Care | Payer: Medicare Other | Source: Ambulatory Visit | Attending: Emergency Medicine | Admitting: Emergency Medicine

## 2020-11-18 VITALS — BP 107/72 | HR 62 | Temp 98.0°F | Resp 19

## 2020-11-18 DIAGNOSIS — M25531 Pain in right wrist: Secondary | ICD-10-CM

## 2020-11-18 DIAGNOSIS — R52 Pain, unspecified: Secondary | ICD-10-CM

## 2020-11-18 DIAGNOSIS — M25431 Effusion, right wrist: Secondary | ICD-10-CM

## 2020-11-18 DIAGNOSIS — M79644 Pain in right finger(s): Secondary | ICD-10-CM | POA: Diagnosis not present

## 2020-11-18 HISTORY — DX: Aortic aneurysm of unspecified site, without rupture: I71.9

## 2020-11-18 MED ORDER — MELOXICAM 7.5 MG PO TABS: 7.5000 mg | ORAL_TABLET | Freq: Every day | ORAL | 0 refills | Status: DC

## 2020-11-18 NOTE — ED Provider Notes (Signed)
[facility] CARE CENTER   [id number] 11/18/20 Arrival Time: 0844  CC: RT hand/ wrist pain  SUBJECTIVE: History from: patient. Mariah Holt is a 70 y.o. female complains of RT wrist and thumb pain x 1 day.  Denies a precipitating event or specific injury.  Does admit to strenous housework yesterday including steam cleaning carpets and lifting 10 lbs.  Pain is diffuse to outside of wrist and thumb.  Describes the pain as constant and sharp, dull, and achy in character.  Has tried OTC medications with relief.  Symptoms are made worse with wrist ROM.  Denies similar symptoms in the past.  Denies fever, chills, erythema, ecchymosis, effusion, weakness, numbness and tingling.  ROS: As per HPI.  All other pertinent ROS negative.     Past Medical History:  Diagnosis Date  . Aortic aneurysm (HCC)   . Family history of breast cancer   . GERD (gastroesophageal reflux disease)   . H/O benign breast biopsy   . High cholesterol   . Hypertension    Past Surgical History:  Procedure Laterality Date  . ABDOMINAL HYSTERECTOMY     due to leio/DUB  . TONSILLECTOMY     No Known Allergies No current facility-administered medications on file prior to encounter.   Current Outpatient Medications on File Prior to Encounter  Medication Sig Dispense Refill  . ALPRAZolam (XANAX) 0.25 MG tablet TK 1 T PO QD PRN    . hydrochlorothiazide (HYDRODIURIL) 12.5 MG tablet TK 1 T PO  QD ALONG WITH LOSARTAN    . ipratropium (ATROVENT) 0.03 % nasal spray Place 2 sprays into both nostrils every 12 (twelve) hours. 30 mL 0  . losartan (COZAAR) 50 MG tablet TK 1 T PO QD ALONG WITH HYDROCHLOROTHIAZIDE    . losartan-hydrochlorothiazide (HYZAAR) 50-12.5 MG per tablet Take 0.5 tablets by mouth daily.      . methocarbamol (ROBAXIN) 500 MG tablet Take 1 tablet (500 mg total) by mouth 2 (two) times daily as needed for muscle spasms. 20 tablet 0  . pravastatin (PRAVACHOL) 20 MG tablet TK 1/2 T PO BID    . Vitamin D,  Ergocalciferol, (DRISDOL) 50000 units CAPS capsule Take 50,000 Units by mouth every 7 (seven) days.     Social History   Socioeconomic History  . Marital status: Married    Spouse name: Not on file  . Number of children: Not on file  . Years of education: Not on file  . Highest education level: Not on file  Occupational History  . Not on file  Tobacco Use  . Smoking status: Former Games developer  . Smokeless tobacco: Never Used  Vaping Use  . Vaping Use: Never used  Substance and Sexual Activity  . Alcohol use: Yes    Comment: rarely  . Drug use: No  . Sexual activity: Yes    Birth control/protection: Surgical    Comment: Hysterectomy  Other Topics Concern  . Not on file  Social History Narrative  . Not on file   Social Determinants of Health   Financial Resource Strain: Not on file  Food Insecurity: Not on file  Transportation Needs: Not on file  Physical Activity: Not on file  Stress: Not on file  Social Connections: Not on file  Intimate Partner Violence: Not on file   Family History  Problem Relation Age of Onset  . Breast cancer Mother 25    OBJECTIVE:  Vitals:   11/18/20 0858  BP: 107/72  Pulse: 62  Resp: 19  Temp: 98  F (36.7 C)  TempSrc: Oral  SpO2: 95%    General appearance: ALERT; in no acute distress.  Head: NCAT Lungs: Normal respiratory effort CV: Radial pulse 2+ Musculoskeletal: RT wrist and thumb Inspection: diffuse swelling over RT wrist Palpation: TTP over lateral wrist and 1st MC ROM: LROM about the wrist and 1st digit Strength: 3/5 grip strength Skin: warm and dry Neurologic: Ambulates without difficulty; Sensation intact about the upper extremities Psychological: alert and cooperative; normal mood and affect  DIAGNOSTIC STUDIES:  DG Wrist Complete Right  Result Date: 11/18/2020 CLINICAL DATA:  Right thumb pain. EXAM: RIGHT WRIST - COMPLETE 3+ VIEW; RIGHT THUMB 2+V COMPARISON:  None. FINDINGS: Degenerative changes are noted in the  interphalangeal joint and at the first [facility] joint. No acute or focal osseous abnormality is present otherwise. No acute soft tissue injury is present. 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 Electronically Signed   By: Marin Roberts M.D.   On: 11/18/2020 09:41   DG Finger Thumb Right  Result Date: 11/18/2020 CLINICAL DATA:  Right thumb pain. EXAM: RIGHT WRIST - COMPLETE 3+ VIEW; RIGHT THUMB 2+V COMPARISON:  None. FINDINGS: Degenerative changes are noted in the interphalangeal joint and at the first [facility] joint. No acute or focal osseous abnormality is present otherwise. No acute soft tissue injury is present. 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 Electronically Signed   By: Marin Roberts M.D.   On: 11/18/2020 09:41     X-rays negative for bony abnormalities including fracture, or dislocation.  No soft tissue swelling.    I have reviewed the x-rays myself and the radiologist interpretation. I am in agreement with the radiologist interpretation.     ASSESSMENT & PLAN:  1. Right wrist pain   2. Pain   3. Pain of right thumb     Meds ordered this encounter  Medications  . meloxicam (MOBIC) 7.5 MG tablet    Sig: Take 1 tablet (7.5 mg total) by mouth daily.    Dispense:  20 tablet    Refill:  0    Order Specific Question:   Supervising Provider    Answer:   Eustace Moore [[id number]]   X-rays negative for fracture or dislocation Continue conservative management of rest, ice, and elevation Take mobic as needed for pain relief (may cause abdominal discomfort, ulcers, and GI bleeds avoid taking with other NSAIDs) Follow up with PCP if symptoms persist Return or go to the ER if you have any new or worsening symptoms (fever, chills, chest pain, redness, bruising, numbness, tingling, etc...)   Reviewed expectations re: course of current medical issues. Questions  answered. Outlined signs and symptoms indicating need for more acute intervention. Patient verbalized understanding. After Visit Summary given.    Rennis Harding, PA-C 11/18/20 [phone]

## 2020-11-18 NOTE — ED Triage Notes (Signed)
Pain and swelling to RT wrist and hand since yesterday.  Denies any known injury.  Pt was doing a lot of work yesterday.

## 2020-11-18 NOTE — Discharge Instructions (Addendum)
X-rays negative for fracture or dislocation; showed degenerative changes most likely from arthritis Continue conservative management of rest, ice, and elevation Thumb spica placed Take mobic as needed for pain relief (may cause abdominal discomfort, ulcers, and GI bleeds avoid taking with other NSAIDs) Follow up with PCP if symptoms persist Return or go to the ER if you have any new or worsening symptoms (fever, chills, chest pain, redness, bruising, numbness, tingling, etc...)

## 2020-11-28 ENCOUNTER — Other Ambulatory Visit: Payer: Self-pay

## 2020-11-28 ENCOUNTER — Ambulatory Visit
Admission: EM | Admit: 2020-11-28 | Discharge: 2020-11-28 | Disposition: A | Discharge status: Home / Self Care | Payer: Medicare Other | Attending: Emergency Medicine | Admitting: Emergency Medicine

## 2020-11-28 ENCOUNTER — Encounter: Payer: Self-pay | Admitting: Emergency Medicine

## 2020-11-28 DIAGNOSIS — M25531 Pain in right wrist: Secondary | ICD-10-CM | POA: Diagnosis not present

## 2020-11-28 DIAGNOSIS — M79644 Pain in right finger(s): Secondary | ICD-10-CM

## 2020-11-28 DIAGNOSIS — M778 Other enthesopathies, not elsewhere classified: Secondary | ICD-10-CM

## 2020-11-28 MED ORDER — PREDNISONE 20 MG PO TABS: 20.0000 mg | ORAL_TABLET | Freq: Two times a day (BID) | ORAL | 0 refills | Status: AC

## 2020-11-28 NOTE — Discharge Instructions (Signed)
Continue conservative management of rest, ice, and elevation Continue with thumb-spica brace Prednisone prescribed.  Take as directed Follow up with orthopedist for further evaluation and management.  They may be able to order additional imaging, physical therapy and/or perform injections Return or go to the ER if you have any new or worsening symptoms (fever, chills, chest pain, redness, swelling, bruising, etc...)

## 2020-11-28 NOTE — ED Triage Notes (Signed)
Right side wrist pain.  Wearing thumb spica splint.

## 2020-11-28 NOTE — ED Provider Notes (Signed)
[facility]   [id number] 11/28/20 Arrival Time: 0940  CC: Right wrist/ thumb pain  SUBJECTIVE: History from: patient. Mariah Holt is a 70 y.o. female complains of right wrist and thumb pain x 10 days.  Denies a precipitating event or specific injury.  Reports strenuous housework prior to symptoms  Localizes the RT wrist and thumb.  Describes the pain as constant, sharp, dull, and achy in character.  Has tried OTC medications without relief. Did not tried prescribed mobic due to possible CV SE.  Symptoms are made worse with ROM.  Denies similar symptoms in the past.  Denies fever, chills, erythema, ecchymosis, effusion, weakness, numbness and tingling.  ROS: As per HPI.  All other pertinent ROS negative.     Past Medical History:  Diagnosis Date  . Aortic aneurysm (HCC)   . Family history of breast cancer   . GERD (gastroesophageal reflux disease)   . H/O benign breast biopsy   . High cholesterol   . Hypertension    Past Surgical History:  Procedure Laterality Date  . ABDOMINAL HYSTERECTOMY     due to leio/DUB  . TONSILLECTOMY     No Known Allergies No current facility-administered medications on file prior to encounter.   Current Outpatient Medications on File Prior to Encounter  Medication Sig Dispense Refill  . ALPRAZolam (XANAX) 0.25 MG tablet TK 1 T PO QD PRN    . hydrochlorothiazide (HYDRODIURIL) 12.5 MG tablet TK 1 T PO  QD ALONG WITH LOSARTAN    . ipratropium (ATROVENT) 0.03 % nasal spray Place 2 sprays into both nostrils every 12 (twelve) hours. 30 mL 0  . losartan (COZAAR) 50 MG tablet TK 1 T PO QD ALONG WITH HYDROCHLOROTHIAZIDE    . losartan-hydrochlorothiazide (HYZAAR) 50-12.5 MG per tablet Take 0.5 tablets by mouth daily.      . pravastatin (PRAVACHOL) 20 MG tablet TK 1/2 T PO BID    . Vitamin D, Ergocalciferol, (DRISDOL) 50000 units CAPS capsule Take 50,000 Units by mouth every 7 (seven) days.     Social History   Socioeconomic History  .  Marital status: Married    Spouse name: Not on file  . Number of children: Not on file  . Years of education: Not on file  . Highest education level: Not on file  Occupational History  . Not on file  Tobacco Use  . Smoking status: Former Games developer  . Smokeless tobacco: Never Used  Vaping Use  . Vaping Use: Never used  Substance and Sexual Activity  . Alcohol use: Yes    Comment: rarely  . Drug use: No  . Sexual activity: Yes    Birth control/protection: Surgical    Comment: Hysterectomy  Other Topics Concern  . Not on file  Social History Narrative  . Not on file   Social Determinants of Health   Financial Resource Strain: Not on file  Food Insecurity: Not on file  Transportation Needs: Not on file  Physical Activity: Not on file  Stress: Not on file  Social Connections: Not on file  Intimate Partner Violence: Not on file   Family History  Problem Relation Age of Onset  . Breast cancer Mother 44    OBJECTIVE:  Vitals:   11/28/20 0950  BP: 126/85  Pulse: 69  Resp: 16  Temp: 97.9 F (36.6 C)  TempSrc: Tympanic  SpO2: 96%    General appearance: ALERT; in no acute distress.  Head: NCAT Lungs: Normal respiratory effort CV: Radial pulse 2+  Musculoskeletal: RT wrist  Inspection: diffuse swelling RT wrist Palpation: diffusely TTP over lateral wrist and 1st MC ROM: LROM Strength: deferred Skin: warm and dry Neurologic: Ambulates without difficulty Psychological: alert and cooperative; normal mood and affect   ASSESSMENT & PLAN:  1. Right wrist pain   2. Thumb pain, right   3. Right wrist tendinitis     Meds ordered this encounter  Medications  . predniSONE (DELTASONE) 20 MG tablet    Sig: Take 1 tablet (20 mg total) by mouth 2 (two) times daily with a meal for 5 days.    Dispense:  10 tablet    Refill:  0    Order Specific Question:   Supervising Provider    Answer:   Eustace Moore [[id number]]    Continue conservative management of rest, ice,  and elevation Continue with thumb-spica brace Prednisone prescribed.  Take as directed Follow up with orthopedist for further evaluation and management.  They may be able to order additional imaging, physical therapy and/or perform injections Return or go to the ER if you have any new or worsening symptoms (fever, chills, chest pain, redness, swelling, bruising, etc...)    Reviewed expectations re: course of current medical issues. Questions answered. Outlined signs and symptoms indicating need for more acute intervention. Patient verbalized understanding. After Visit Summary given.    Rennis Harding, PA-C 11/28/20 1001

## 2021-01-10 ENCOUNTER — Other Ambulatory Visit: Payer: Self-pay

## 2021-01-10 ENCOUNTER — Emergency Department (HOSPITAL_COMMUNITY)
Admission: EM | Admit: 2021-01-10 | Discharge: 2021-01-10 | Disposition: A | Discharge status: Home / Self Care | Payer: Medicare Other | Attending: Emergency Medicine | Admitting: Emergency Medicine

## 2021-01-10 ENCOUNTER — Encounter (HOSPITAL_COMMUNITY): Payer: Self-pay

## 2021-01-10 DIAGNOSIS — D72829 Elevated white blood cell count, unspecified: Secondary | ICD-10-CM | POA: Diagnosis not present

## 2021-01-10 DIAGNOSIS — I10 Essential (primary) hypertension: Secondary | ICD-10-CM | POA: Diagnosis not present

## 2021-01-10 DIAGNOSIS — R5383 Other fatigue: Secondary | ICD-10-CM | POA: Insufficient documentation

## 2021-01-10 DIAGNOSIS — R42 Dizziness and giddiness: Secondary | ICD-10-CM | POA: Diagnosis not present

## 2021-01-10 DIAGNOSIS — Z87891 Personal history of nicotine dependence: Secondary | ICD-10-CM | POA: Diagnosis not present

## 2021-01-10 LAB — URINALYSIS, ROUTINE W REFLEX MICROSCOPIC
Bilirubin Urine: NEGATIVE
Glucose, UA: NEGATIVE mg/dL
Hgb urine dipstick: NEGATIVE
Ketones, ur: NEGATIVE mg/dL
Leukocytes,Ua: NEGATIVE
Nitrite: NEGATIVE
Protein, ur: NEGATIVE mg/dL
Specific Gravity, Urine: 1.02 (ref 1.005–1.030)
pH: 6 (ref 5.0–8.0)

## 2021-01-10 LAB — COMPREHENSIVE METABOLIC PANEL
ALT: 16 U/L (ref 0–44)
AST: 17 U/L (ref 15–41)
Albumin: 3.9 g/dL (ref 3.5–5.0)
Alkaline Phosphatase: 52 U/L (ref 38–126)
Anion gap: 10 (ref 5–15)
BUN: 26 mg/dL — ABNORMAL HIGH (ref 8–23)
CO2: 24 mmol/L (ref 22–32)
Calcium: 9 mg/dL (ref 8.9–10.3)
Chloride: 102 mmol/L (ref 98–111)
Creatinine, Ser: 1.19 mg/dL — ABNORMAL HIGH (ref 0.44–1.00)
GFR, Estimated: 49 mL/min — ABNORMAL LOW (ref >60)
Glucose, Bld: 155 mg/dL — ABNORMAL HIGH (ref 70–99)
Potassium: 3.4 mmol/L — ABNORMAL LOW (ref 3.5–5.1)
Sodium: 136 mmol/L (ref 135–145)
Total Bilirubin: 0.6 mg/dL (ref 0.3–1.2)
Total Protein: 7.7 g/dL (ref 6.5–8.1)

## 2021-01-10 LAB — CBC WITH DIFFERENTIAL/PLATELET
Abs Immature Granulocytes: 0.03 10*3/uL (ref 0.00–0.07)
Basophils Absolute: 0.1 10*3/uL (ref 0.0–0.1)
Basophils Relative: 0 %
Eosinophils Absolute: 0.2 10*3/uL (ref 0.0–0.5)
Eosinophils Relative: 2 %
HCT: 40.1 % (ref 36.0–46.0)
Hemoglobin: 13.5 g/dL (ref 12.0–15.0)
Immature Granulocytes: 0 %
Lymphocytes Relative: 27 %
Lymphs Abs: 3.3 10*3/uL (ref 0.7–4.0)
MCH: 29.6 pg (ref 26.0–34.0)
MCHC: 33.7 g/dL (ref 30.0–36.0)
MCV: 87.9 fL (ref 80.0–100.0)
Monocytes Absolute: 1 10*3/uL (ref 0.1–1.0)
Monocytes Relative: 8 %
Neutro Abs: 8 10*3/uL — ABNORMAL HIGH (ref 1.7–7.7)
Neutrophils Relative %: 63 %
Platelets: 339 10*3/uL (ref 150–400)
RBC: 4.56 MIL/uL (ref 3.87–5.11)
RDW: 14.3 % (ref 11.5–15.5)
WBC: 12.5 10*3/uL — ABNORMAL HIGH (ref 4.0–10.5)
nRBC: 0 % (ref 0.0–0.2)

## 2021-01-10 LAB — LACTIC ACID, PLASMA: Lactic Acid, Venous: 1.6 mmol/L (ref 0.5–1.9)

## 2021-01-10 LAB — TROPONIN I (HIGH SENSITIVITY): Troponin I (High Sensitivity): 4 ng/L (ref <18)

## 2021-01-10 LAB — CBG MONITORING, ED: Glucose-Capillary: 153 mg/dL — ABNORMAL HIGH (ref 70–99)

## 2021-01-10 MED ORDER — SODIUM CHLORIDE 0.9 % IV BOLUS
500.0000 mL | Freq: Once | INTRAVENOUS | Status: AC
  Administered 2021-01-10: 500 mL via INTRAVENOUS

## 2021-01-10 NOTE — ED Triage Notes (Signed)
pov from home cc of low blood pressure at home with dizziness. Reading at home was 84/56 at 7:45pm. Kept taking it but was continuously going up. However she said she started feeling dizzy and light headed and came in. Pt was ambulatory to tx room. 133/77 in triage.

## 2021-01-10 NOTE — ED Provider Notes (Signed)
Emergency Department Provider Note   I have reviewed the triage vital signs and the nursing notes.   HISTORY  Chief Complaint Dizziness   HPI Mariah Holt is a 70 y.o. female with PMH of HTN and HLD presents to the ED with lightheadedness and low BP at home.  Patient tells me she was in her usual state of health but this evening felt somewhat fatigued.  She typically takes her blood pressure every other day and keeps a log of the values.  In review of the log she typically runs in the 110-120 systolic range but tonight was getting blood pressures in the 80-90 range systolic.  She felt like it could not be right and so retook it and got numbers into the low 100s but decided to be evaluated in the emergency department given her fatigue.  She been taking her medications as usual.  No recent changes to blood pressure or other medications.  She has not been feeling subjective fever, chills, body aches.  She not having chest pain or shortness of breath.  No headaches.  She did not pass out.  She has been eating and drinking well.  She has not had vomiting or diarrhea.  No similar episodes like this in the past.   Past Medical History:  Diagnosis Date  . Aortic aneurysm (HCC)   . Family history of breast cancer   . GERD (gastroesophageal reflux disease)   . H/O benign breast biopsy   . High cholesterol   . Hypertension     There are no problems to display for this patient.   Past Surgical History:  Procedure Laterality Date  . ABDOMINAL HYSTERECTOMY     due to leio/DUB  . TONSILLECTOMY      Allergies Patient has no known allergies.  Family History  Problem Relation Age of Onset  . Breast cancer Mother 31    Social History Social History   Tobacco Use  . Smoking status: Former Games developer  . Smokeless tobacco: Never Used  Vaping Use  . Vaping Use: Never used  Substance Use Topics  . Alcohol use: Yes    Comment: rarely  . Drug use: No    Review of  Systems  Constitutional: No fever/chills. Positive fatigue.  Eyes: No visual changes. ENT: No sore throat. Cardiovascular: Denies chest pain. Low BP reading at home.  Respiratory: Denies shortness of breath. Gastrointestinal: No abdominal pain.  No nausea, no vomiting.  No diarrhea.  No constipation. Genitourinary: Negative for dysuria. Musculoskeletal: Negative for back pain. Skin: Negative for rash. Neurological: Negative for headaches, focal weakness or numbness.  10-point ROS otherwise negative.  ____________________________________________   PHYSICAL EXAM:  VITAL SIGNS: ED Triage Vitals  Enc Vitals Group     BP 01/10/21 2006 133/77     Pulse Rate 01/10/21 2006 86     Resp 01/10/21 2006 14     Temp 01/10/21 2006 98.1 F (36.7 C)     Temp Source 01/10/21 2006 Oral     SpO2 01/10/21 2006 96 %     Weight 01/10/21 2012 212 lb 1.3 oz (96.2 kg)     Height 01/10/21 2012 5\' 9"  (1.753 m)   Constitutional: Alert and oriented. Well appearing and in no acute distress. Eyes: Conjunctivae are normal.  Head: Atraumatic. Nose: No congestion/rhinnorhea. Mouth/Throat: Mucous membranes are moist.   Neck: No stridor.   Cardiovascular: Normal rate, regular rhythm. Good peripheral circulation. Grossly normal heart sounds.   Respiratory: Normal respiratory effort.  No retractions. Lungs CTAB. Gastrointestinal: Soft and nontender. No distention.  Musculoskeletal: No lower extremity tenderness nor edema. No gross deformities of extremities. Neurologic:  Normal speech and language. No gross focal neurologic deficits are appreciated. Ambulatory down the hall with steady gait.  Skin:  Skin is warm, dry and intact. No rash noted.   ____________________________________________   LABS (all labs ordered are listed, but only abnormal results are displayed)  Labs Reviewed  COMPREHENSIVE METABOLIC PANEL - Abnormal; Notable for the following components:      Result Value   Potassium 3.4 (*)     Glucose, Bld 155 (*)    BUN 26 (*)    Creatinine, Ser 1.19 (*)    GFR, Estimated 49 (*)    All other components within normal limits  CBC WITH DIFFERENTIAL/PLATELET - Abnormal; Notable for the following components:   WBC 12.5 (*)    Neutro Abs 8.0 (*)    All other components within normal limits  CBG MONITORING, ED - Abnormal; Notable for the following components:   Glucose-Capillary 153 (*)    All other components within normal limits  URINALYSIS, ROUTINE W REFLEX MICROSCOPIC  LACTIC ACID, PLASMA  TROPONIN I (HIGH SENSITIVITY)   ____________________________________________  EKG   EKG Interpretation  Date/Time:  Sunday Jan 10 2021 20:10:06 EDT Ventricular Rate:  88 PR Interval:  171 QRS Duration: 109 QT Interval:  371 QTC Calculation: 449 R Axis:   19 Text Interpretation: Sinus rhythm Left ventricular hypertrophy Inferior infarct, old Confirmed by Alona Bene [phone]) on 01/10/2021 8:27:57 PM       ____________________________________________  RADIOLOGY  None ____________________________________________   PROCEDURES  Procedure(s) performed:   Procedures  None ____________________________________________   INITIAL IMPRESSION / ASSESSMENT AND PLAN / ED COURSE  Pertinent labs & imaging results that were available during my care of the patient were reviewed by me and considered in my medical decision making (see chart for details).   Patient presents to the emergency department with some fatigue and low blood pressures at home.  No clear provoking factors for this.  She does not appear septic and has no history to support this.  Her vital signs here are normal including her blood pressure of 133/77.  Plan for orthostatic vital signs.  No changes to her recent medications.  Abdomen is soft and nontender.  We will give gentle IV fluid bolus and reassess. Patient to remain on telemetry while in the ED.   Patient's labs reviewed. Mild leukocytosis. Added lactate  which was normal. Mild elevated creatinine. Patient feeling improved with IVF. Troponin. UA without infection. Ambulatory without difficulty.  ____________________________________________  FINAL CLINICAL IMPRESSION(S) / ED DIAGNOSES  Final diagnoses:  Dizziness     MEDICATIONS GIVEN DURING THIS VISIT:  Medications  sodium chloride 0.9 % bolus 500 mL (0 mLs Intravenous Stopped 01/10/21 2132)     Note:  This document was prepared using Dragon voice recognition software and may include unintentional dictation errors.  Alona Bene, MD, [facility]    Josias Tomerlin, Arlyss Repress, MD 01/11/21 [phone]

## 2021-01-10 NOTE — Discharge Instructions (Signed)
You were seen in the emergency department tonight with some low blood pressures at home and dizziness.  Your lab work here is overall very reassuring.  Your blood pressure is normalized and stayed normal here.  Your labs are looking like you may have some mild dehydration so please drink plenty of hydrating fluids and check your blood pressure once in the morning and once in the evening.  Please record these numbers to your primary care doctor.  They may decide to make some changes to your medicines based on these numbers.  If develop any new or suddenly worsening symptoms please return to the emergency department for reevaluation.

## 2021-01-29 ENCOUNTER — Ambulatory Visit
Admission: RE | Admit: 2021-01-29 | Discharge: 2021-01-29 | Disposition: A | Discharge status: Home / Self Care | Payer: Medicare Other | Source: Ambulatory Visit | Attending: Emergency Medicine | Admitting: Emergency Medicine

## 2021-01-29 ENCOUNTER — Other Ambulatory Visit: Payer: Self-pay

## 2021-01-29 VITALS — BP 127/77 | HR 90 | Temp 98.1°F | Resp 16

## 2021-01-29 DIAGNOSIS — R3915 Urgency of urination: Secondary | ICD-10-CM | POA: Diagnosis not present

## 2021-01-29 DIAGNOSIS — R350 Frequency of micturition: Secondary | ICD-10-CM | POA: Diagnosis not present

## 2021-01-29 LAB — POCT URINALYSIS DIP (MANUAL ENTRY)
Bilirubin, UA: NEGATIVE
Glucose, UA: NEGATIVE mg/dL
Ketones, POC UA: NEGATIVE mg/dL
Leukocytes, UA: NEGATIVE
Nitrite, UA: NEGATIVE
Protein Ur, POC: NEGATIVE mg/dL
Spec Grav, UA: 1.025 (ref 1.010–1.025)
Urobilinogen, UA: 1 E.U./dL
pH, UA: 5.5 (ref 5.0–8.0)

## 2021-01-29 MED ORDER — CEPHALEXIN 500 MG PO CAPS: 500.0000 mg | ORAL_CAPSULE | Freq: Two times a day (BID) | ORAL | 0 refills | Status: AC

## 2021-01-29 NOTE — ED Provider Notes (Signed)
[facility]   CC: Burning with urination  SUBJECTIVE:  Mariah Holt is a 70 y.o. female who complains of urinary frequency, urgency and bladder pressure x 2 days.  Reports holding her urine at night.   Has tried OTC medications without relief.  Symptoms are made worse with urination.  Admits to similar symptoms in the past.  Denies fever, chills, nausea, vomiting, abdominal pain, flank pain, abnormal vaginal discharge or bleeding, hematuria.    LMP: No LMP recorded. Patient has had a hysterectomy.  ROS: As in HPI.  All other pertinent ROS negative.     Past Medical History:  Diagnosis Date  . Aortic aneurysm (HCC)   . Family history of breast cancer   . GERD (gastroesophageal reflux disease)   . H/O benign breast biopsy   . High cholesterol   . Hypertension    Past Surgical History:  Procedure Laterality Date  . ABDOMINAL HYSTERECTOMY     due to leio/DUB  . TONSILLECTOMY     No Known Allergies No current facility-administered medications on file prior to encounter.   Current Outpatient Medications on File Prior to Encounter  Medication Sig Dispense Refill  . ALPRAZolam (XANAX) 0.25 MG tablet Take 0.25 mg by mouth at bedtime as needed.    . Coenzyme Q10 100 MG capsule Take 100 mg by mouth daily.    . hydrochlorothiazide (HYDRODIURIL) 12.5 MG tablet TK 1 T PO  QD ALONG WITH LOSARTAN (Patient not taking: Reported on 01/10/2021)    . ipratropium (ATROVENT) 0.03 % nasal spray Place 2 sprays into both nostrils every 12 (twelve) hours. (Patient not taking: Reported on 01/10/2021) 30 mL 0  . losartan (COZAAR) 50 MG tablet Take 75 mg by mouth daily. (Patient not taking: No sig reported)    . losartan-hydrochlorothiazide (HYZAAR) 50-12.5 MG per tablet Take 0.5 tablets by mouth daily. (Patient not taking: No sig reported)    . losartan-hydrochlorothiazide (HYZAAR) 50-12.5 MG tablet Take 1.5 tablets by mouth daily.    . metoprolol succinate (TOPROL-XL) 25 MG 24 hr tablet Take  1 tablet by mouth daily.    Marland Kitchen omega-3 acid ethyl esters (LOVAZA) 1 g capsule Take 2 g by mouth 2 (two) times daily.    . pravastatin (PRAVACHOL) 20 MG tablet Take 10 mg by mouth daily.    . Vitamin D, Ergocalciferol, (DRISDOL) 50000 units CAPS capsule Take 50,000 Units by mouth every 7 (seven) days. (Patient not taking: No sig reported)     Social History   Socioeconomic History  . Marital status: Married    Spouse name: Not on file  . Number of children: Not on file  . Years of education: Not on file  . Highest education level: Not on file  Occupational History  . Not on file  Tobacco Use  . Smoking status: Former Games developer  . Smokeless tobacco: Never Used  Vaping Use  . Vaping Use: Never used  Substance and Sexual Activity  . Alcohol use: Yes    Comment: rarely  . Drug use: No  . Sexual activity: Yes    Birth control/protection: Surgical    Comment: Hysterectomy  Other Topics Concern  . Not on file  Social History Narrative  . Not on file   Social Determinants of Health   Financial Resource Strain: Not on file  Food Insecurity: Not on file  Transportation Needs: Not on file  Physical Activity: Not on file  Stress: Not on file  Social Connections: Not on file  Intimate Partner Violence: Not on file   Family History  Problem Relation Age of Onset  . Breast cancer Mother 40    OBJECTIVE:  Vitals:   01/29/21 1454  BP: 127/77  Pulse: 90  Resp: 16  Temp: 98.1 F (36.7 C)  TempSrc: Oral  SpO2: 95%   General appearance: Alert in no acute distress HEENT: NCAT.  Oropharynx clear.  Lungs: clear to auscultation bilaterally without adventitious breath sounds Heart: regular rate and rhythm.   Abdomen: soft; non-distended; no tenderness; bowel sounds present; no guarding Back: no CVA tenderness Extremities: no edema; symmetrical with no gross deformities Skin: warm and dry Neurologic: Ambulates from chair to exam table without difficulty Psychological: alert and  cooperative; normal mood and affect  Labs Reviewed  POCT URINALYSIS DIP (MANUAL ENTRY) - Abnormal; Notable for the following components:      Result Value   Clarity, UA cloudy (*)    Blood, UA trace-intact (*)    All other components within normal limits    ASSESSMENT & PLAN:  1. Urinary frequency   2. Urinary urgency     Meds ordered this encounter  Medications  . cephALEXin (KEFLEX) 500 MG capsule    Sig: Take 1 capsule (500 mg total) by mouth 2 (two) times daily for 10 days.    Dispense:  20 capsule    Refill:  0    Order Specific Question:   Supervising Provider    Answer:   Eustace Moore [[id number]]    Urine culture sent.  We will call you with the results.   Push fluids and get plenty of rest.   Take antibiotic as directed and to completion Follow up with PCP if symptoms persists Return here or go to ER if you have any new or worsening symptoms such as fever, worsening abdominal pain, nausea/vomiting, flank pain, etc...  Outlined signs and symptoms indicating need for more acute intervention. Patient verbalized understanding. After Visit Summary given.     Rennis Harding, PA-C 01/29/21 1512

## 2021-01-29 NOTE — Discharge Instructions (Addendum)
Urine culture sent.  We will call you with the results.   Push fluids and get plenty of rest.   Take antibiotic as directed and to completion Follow up with PCP if symptoms persists Return here or go to ER if you have any new or worsening symptoms such as fever, worsening abdominal pain, nausea/vomiting, flank pain, etc... 

## 2021-01-29 NOTE — ED Triage Notes (Signed)
Lower abd pain with urinary urgency and discomfort while urinating since wed.

## 2021-02-15 ENCOUNTER — Other Ambulatory Visit: Payer: Self-pay | Admitting: Physician Assistant

## 2021-02-15 DIAGNOSIS — M25431 Effusion, right wrist: Secondary | ICD-10-CM

## 2021-02-27 ENCOUNTER — Ambulatory Visit
Admission: RE | Admit: 2021-02-27 | Discharge: 2021-02-27 | Disposition: A | Discharge status: Home / Self Care | Payer: Medicare Other | Source: Ambulatory Visit | Attending: Physician Assistant | Admitting: Physician Assistant

## 2021-02-27 DIAGNOSIS — M25431 Effusion, right wrist: Secondary | ICD-10-CM

## 2021-02-27 MED ORDER — GADOBENATE DIMEGLUMINE 529 MG/ML IV SOLN
19.0000 mL | Freq: Once | INTRAVENOUS | Status: AC | PRN
  Administered 2021-02-27: 19 mL via INTRAVENOUS

## 2021-08-15 IMAGING — DX DG FINGER THUMB 2+V*R*
3 series · 3 of 3 positions shown · non-contrast
Comparison: None.

CLINICAL DATA: Right thumb pain.

EXAM:
RIGHT WRIST - COMPLETE 3+ VIEW; RIGHT THUMB 2+V

[thumb mlo]
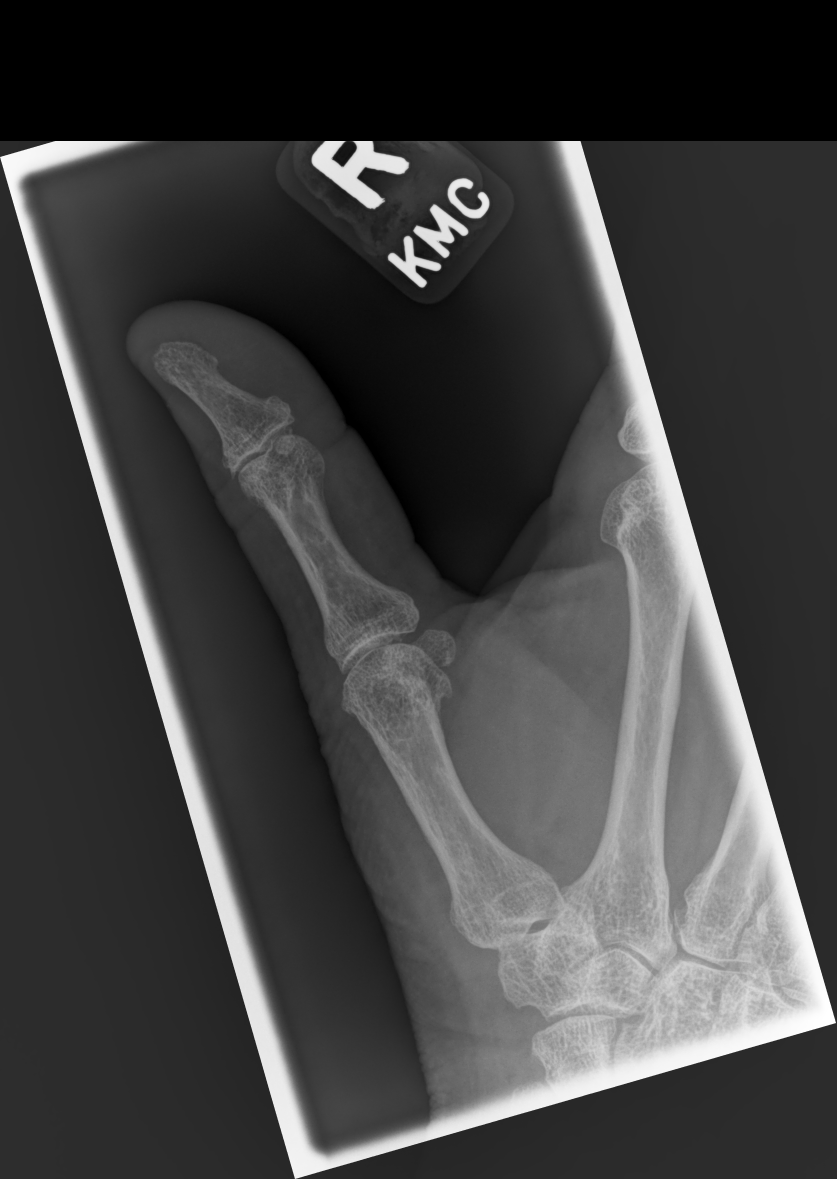

[thumb lat]
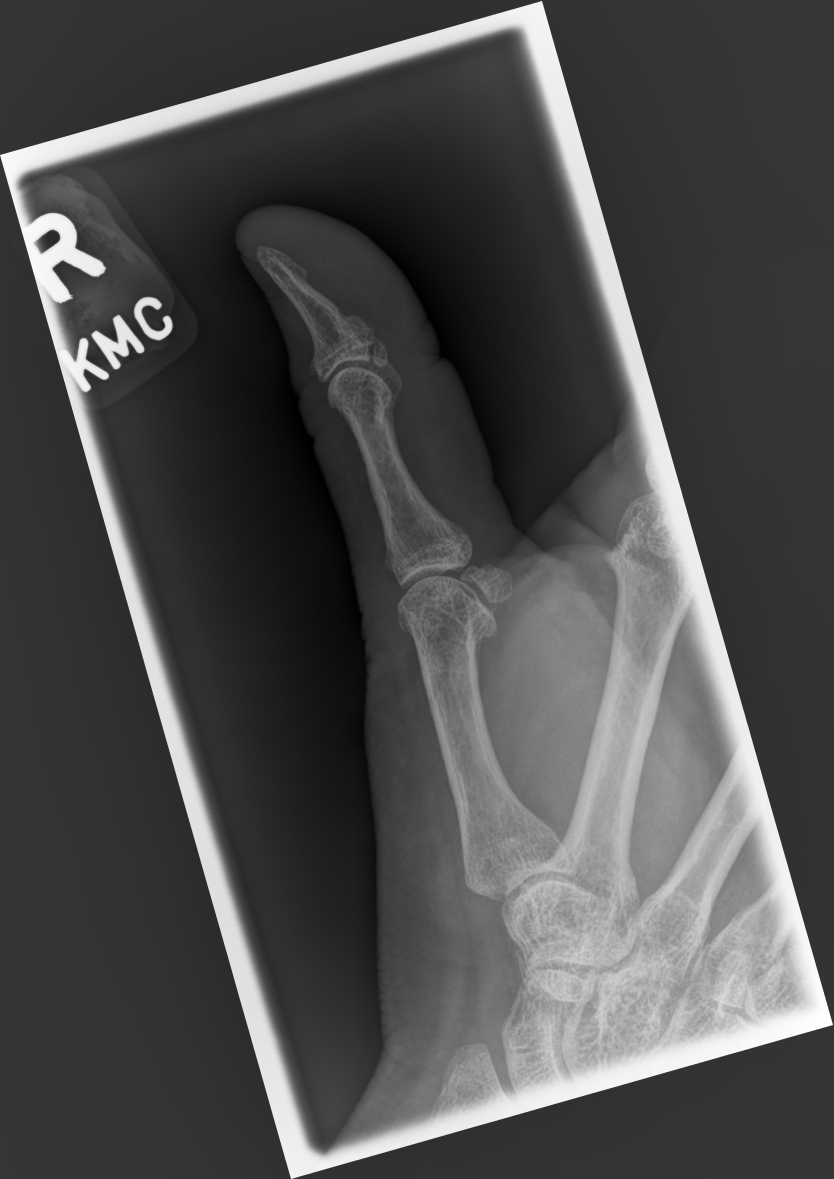

[thumb ap]
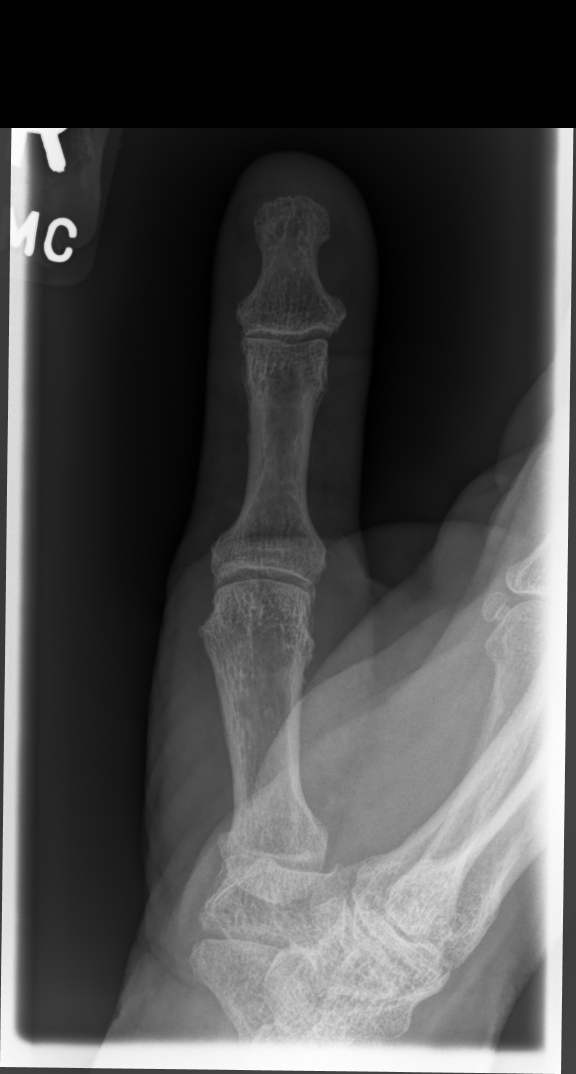

[3 of 3 positions shown; findings below may reference images not displayed]

FINDINGS: Degenerative changes are noted in the interphalangeal joint and at
the first CMC joint. No acute or focal osseous abnormality is
present otherwise. No acute soft tissue injury is present.
IMPRESSION: Degenerative changes, most pronounced in the interphalangeal joint
and first CMC joint.

## 2022-06-25 ENCOUNTER — Ambulatory Visit
Admission: EM | Admit: 2022-06-25 | Discharge: 2022-06-25 | Disposition: A | Discharge status: Home / Self Care | Payer: Medicare Other

## 2022-06-25 DIAGNOSIS — R0982 Postnasal drip: Secondary | ICD-10-CM | POA: Diagnosis not present

## 2022-06-25 DIAGNOSIS — R052 Subacute cough: Secondary | ICD-10-CM

## 2022-06-25 MED ORDER — CETIRIZINE HCL 10 MG PO TABS: 10.0000 mg | ORAL_TABLET | Freq: Every day | ORAL | 0 refills | Status: DC

## 2022-06-25 MED ORDER — PROMETHAZINE-DM 6.25-15 MG/5ML PO SYRP: 2.5000 mL | ORAL_SOLUTION | Freq: Three times a day (TID) | ORAL | 0 refills | Status: DC | PRN

## 2022-06-25 MED ORDER — PSEUDOEPHEDRINE HCL 30 MG PO TABS: 30.0000 mg | ORAL_TABLET | Freq: Three times a day (TID) | ORAL | 0 refills | Status: DC | PRN

## 2022-06-25 MED ORDER — BENZONATATE 100 MG PO CAPS: 100.0000 mg | ORAL_CAPSULE | Freq: Three times a day (TID) | ORAL | 0 refills | Status: DC | PRN

## 2022-06-25 NOTE — ED Provider Notes (Signed)
[facility]  Note:  This document was prepared using Dragon voice recognition software and may include unintentional dictation errors.  MRN: [id number] DOB: [birth year]  Subjective:   Mariah Holt is a 71 y.o. female presenting for 5-day history of persistent cough, sinus drainage.  Symptoms started after she got her COVID and flu vaccine.  Initially she felt malaise, fatigue and that resolved but the cough remained.  She now feels like she has some left-sided back pain from coughing.  No shortness of breath, chest pain, wheezing, fevers.  No history of respiratory disorders.  Patient does not a smoke.  No current facility-administered medications for this encounter.  Current Outpatient Medications:    ALPRAZolam (XANAX) 0.25 MG tablet, Take 0.25 mg by mouth at bedtime as needed., Disp: , Rfl:    Coenzyme Q10 100 MG capsule, Take 100 mg by mouth daily., Disp: , Rfl:    hydrochlorothiazide (HYDRODIURIL) 12.5 MG tablet, TK 1 T PO  QD ALONG WITH LOSARTAN (Patient not taking: Reported on 01/10/2021), Disp: , Rfl:    ipratropium (ATROVENT) 0.03 % nasal spray, Place 2 sprays into both nostrils every 12 (twelve) hours. (Patient not taking: Reported on 01/10/2021), Disp: 30 mL, Rfl: 0   losartan (COZAAR) 50 MG tablet, Take 75 mg by mouth daily. (Patient not taking: No sig reported), Disp: , Rfl:    losartan-hydrochlorothiazide (HYZAAR) 50-12.5 MG per tablet, Take 0.5 tablets by mouth daily. (Patient not taking: No sig reported), Disp: , Rfl:    losartan-hydrochlorothiazide (HYZAAR) 50-12.5 MG tablet, Take 1.5 tablets by mouth daily., Disp: , Rfl:    metoprolol succinate (TOPROL-XL) 25 MG 24 hr tablet, Take 1 tablet by mouth daily., Disp: , Rfl:    omega-3 acid ethyl esters (LOVAZA) 1 g capsule, Take 2 g by mouth 2 (two) times daily., Disp: , Rfl:    pravastatin (PRAVACHOL) 20 MG tablet, Take 10 mg by mouth daily., Disp: , Rfl:    Vitamin D, Ergocalciferol, (DRISDOL) 50000 units CAPS  capsule, Take 50,000 Units by mouth every 7 (seven) days. (Patient not taking: No sig reported), Disp: , Rfl:    No Known Allergies  Past Medical History:  Diagnosis Date   Aortic aneurysm (HCC)    Family history of breast cancer    GERD (gastroesophageal reflux disease)    H/O benign breast biopsy    High cholesterol    Hypertension      Past Surgical History:  Procedure Laterality Date   ABDOMINAL HYSTERECTOMY     due to leio/DUB   TONSILLECTOMY      Family History  Problem Relation Age of Onset   Breast cancer Mother 36    Social History   Tobacco Use   Smoking status: Former   Smokeless tobacco: Never  Scientific laboratory technician Use: Never used  Substance Use Topics   Alcohol use: Yes    Comment: rarely   Drug use: No    ROS   Objective:   Vitals: BP 131/73 (BP Location: Right Arm)   Pulse 85   Temp 98.1 F (36.7 C) (Oral)   Resp 17   SpO2 95%   Physical Exam Constitutional:      General: She is not in acute distress.    Appearance: Normal appearance. She is well-developed. She is not ill-appearing, toxic-appearing or diaphoretic.  HENT:     Head: Normocephalic and atraumatic.     Nose: Nose normal.     Mouth/Throat:     Mouth: Mucous  membranes are moist.  Eyes:     General: No scleral icterus.       Right eye: No discharge.        Left eye: No discharge.     Extraocular Movements: Extraocular movements intact.  Cardiovascular:     Rate and Rhythm: Normal rate and regular rhythm.     Heart sounds: Normal heart sounds. No murmur heard.    No friction rub. No gallop.  Pulmonary:     Effort: Pulmonary effort is normal. No respiratory distress.     Breath sounds: No stridor. No wheezing, rhonchi or rales.  Chest:     Chest wall: No tenderness.  Musculoskeletal:       Back:  Skin:    General: Skin is warm and dry.  Neurological:     General: No focal deficit present.     Mental Status: She is alert and oriented to person, place, and time.   Psychiatric:        Mood and Affect: Mood normal.        Behavior: Behavior normal.     Assessment and Plan :   PDMP not reviewed this encounter.  1. Subacute cough   2. Post-nasal drainage     Patient prefers to avoid chest x-rays as she just had 2 in the past month.  I am in agreement.  She has a clear cardiopulmonary exam.  Suspect lingering cough from developing immunity following the recent COVID and flu vaccinations.  Use supportive care. Counseled patient on potential for adverse effects with medications prescribed/recommended today, ER and return-to-clinic precautions discussed, patient verbalized understanding.    Jaynee Eagles, PA-C 06/25/22 1322

## 2022-06-25 NOTE — ED Triage Notes (Signed)
Pt reports fatigue, cough and left sided middle back pain when coughing since 06/20/2022 after COVID vaccine. Ibuprofen gives some relief.

## 2022-06-26 ENCOUNTER — Ambulatory Visit: Payer: Self-pay

## 2023-02-01 NOTE — Progress Notes (Signed)
Sent message, via epic in basket, requesting orders in epic from surgeon.  

## 2023-02-07 ENCOUNTER — Ambulatory Visit (HOSPITAL_COMMUNITY): Payer: Self-pay | Admitting: Emergency Medicine

## 2023-02-07 DIAGNOSIS — G8929 Other chronic pain: Secondary | ICD-10-CM

## 2023-02-07 NOTE — H&P (Signed)
TOTAL KNEE ADMISSION H&P  Patient is being admitted for left total knee arthroplasty.  Subjective:  Chief Complaint:left knee pain.  HPI: Mariah Holt, 72 y.o. female, has a history of pain and functional disability in the left knee due to arthritis and has failed non-surgical conservative treatments for greater than 12 weeks to includeNSAID's and/or analgesics, corticosteriod injections, and activity modification.  Onset of symptoms was gradual, starting 2 years ago with gradually worsening course since that time. The patient noted no past surgery on the left knee(s).  Patient currently rates pain in the left knee(s) at 10 out of 10 with activity. Patient has night pain, worsening of pain with activity and weight bearing, pain that interferes with activities of daily living, and pain with passive range of motion.  Patient has evidence of periarticular osteophytes and joint space narrowing by imaging studies.  There is no active infection.  There are no problems to display for this patient.  Past Medical History:  Diagnosis Date   Aortic aneurysm (HCC)    Family history of breast cancer    GERD (gastroesophageal reflux disease)    H/O benign breast biopsy    High cholesterol    Hypertension     Past Surgical History:  Procedure Laterality Date   ABDOMINAL HYSTERECTOMY     due to leio/DUB   TONSILLECTOMY      Current Outpatient Medications  Medication Sig Dispense Refill Last Dose   acetaminophen (TYLENOL) 650 MG CR tablet Take 1,300 mg by mouth every 8 (eight) hours as needed for pain.      ALPRAZolam (XANAX) 0.25 MG tablet Take 0.25 mg by mouth at bedtime as needed for anxiety or sleep.      Evolocumab (REPATHA) 140 MG/ML SOSY Inject 140 mg into the skin every 14 (fourteen) days.      famotidine (PEPCID) 20 MG tablet Take 20 mg by mouth daily as needed for heartburn or indigestion.      ibuprofen (ADVIL) 200 MG tablet Take 400 mg by mouth every 6 (six) hours as needed for  moderate pain.      losartan-hydrochlorothiazide (HYZAAR) 50-12.5 MG tablet Take 1 tablet by mouth daily.      metoprolol succinate (TOPROL-XL) 25 MG 24 hr tablet Take 1 tablet by mouth daily.      naproxen sodium (ALEVE) 220 MG tablet Take 220-440 mg by mouth daily as needed (pain).      No current facility-administered medications for this visit.   Allergies  Allergen Reactions   Atorvastatin Calcium     Aches and pains   Docosahexaenoic Acid (Dha)     Unknown reaction   Ezetimibe     Unknown reaction   Niacin     Unknown reaction   Simvastatin     Aches and pains   Omeprazole Palpitations    Social History   Tobacco Use   Smoking status: Former   Smokeless tobacco: Never  Substance Use Topics   Alcohol use: Yes    Comment: rarely    Family History  Problem Relation Age of Onset   Breast cancer Mother 49     Review of Systems  Musculoskeletal:  Positive for arthralgias.  All other systems reviewed and are negative.   Objective:  Physical Exam Constitutional:      General: She is not in acute distress.    Appearance: Normal appearance. She is not ill-appearing.  HENT:     Head: Normocephalic and atraumatic.     Right Ear:  External ear normal.     Left Ear: External ear normal.     Nose: Nose normal.     Mouth/Throat:     Mouth: Mucous membranes are moist.     Pharynx: Oropharynx is clear.  Eyes:     Extraocular Movements: Extraocular movements intact.     Conjunctiva/sclera: Conjunctivae normal.  Cardiovascular:     Rate and Rhythm: Normal rate and regular rhythm.     Pulses: Normal pulses.     Heart sounds: Normal heart sounds.  Pulmonary:     Effort: Pulmonary effort is normal.     Breath sounds: Normal breath sounds.  Abdominal:     General: Bowel sounds are normal.     Palpations: Abdomen is soft.     Tenderness: There is no abdominal tenderness.  Musculoskeletal:        General: Tenderness present.     Cervical back: Normal range of motion  and neck supple.     Comments: Left knee TTP over medial joint line.  ROM 0-120 with elicited pain.  No swelling or calf tenderness.  No lesion over area of chief complaint.  BLE appear grossly neurovascularly intact.  Gait mildly antalgic.  Skin:    General: Skin is warm and dry.  Neurological:     Mental Status: She is alert and oriented to person, place, and time. Mental status is at baseline.  Psychiatric:        Mood and Affect: Mood normal.        Behavior: Behavior normal.     Vital signs in last 24 hours: @VSRANGES @  Labs:   Estimated body mass index is 31.32 kg/m as calculated from the following:   Height as of 01/10/21: 5\' 9"  (1.753 m).   Weight as of 01/10/21: 96.2 kg.   Imaging Review Plain radiographs demonstrate severe degenerative joint disease of the left knee(s).  The bone quality appears to be good for age and reported activity level.      Assessment/Plan:  End stage arthritis, left knee   The patient history, physical examination, clinical judgment of the provider and imaging studies are consistent with end stage degenerative joint disease of the left knee(s) and total knee arthroplasty is deemed medically necessary. The treatment options including medical management, injection therapy arthroscopy and arthroplasty were discussed at length. The risks and benefits of total knee arthroplasty were presented and reviewed. The risks due to aseptic loosening, infection, stiffness, patella tracking problems, thromboembolic complications and other imponderables were discussed. The patient acknowledged the explanation, agreed to proceed with the plan and consent was signed. Patient is being admitted for inpatient treatment for surgery, pain control, PT, OT, prophylactic antibiotics, VTE prophylaxis, progressive ambulation and ADL's and discharge planning. The patient is planning to be discharged home with outpatient PT.     Patient's anticipated LOS is less than 2  midnights, meeting these requirements: - Younger than 75 - Lives within 1 hour of care - Has a competent adult at home to recover with post-op recover - NO history of  - Chronic pain requiring opiods  - Diabetes  - Coronary Artery Disease  - Heart failure  - Heart attack  - Stroke  - DVT/VTE  - Cardiac arrhythmia  - Respiratory Failure/COPD  - Renal failure  - Anemia  - Advanced Liver disease

## 2023-02-07 NOTE — H&P (View-Only) (Signed)
TOTAL KNEE ADMISSION H&P  Patient is being admitted for left total knee arthroplasty.  Subjective:  Chief Complaint:left knee pain.  HPI: Mariah Holt, 72 y.o. female, has a history of pain and functional disability in the left knee due to arthritis and has failed non-surgical conservative treatments for greater than 12 weeks to includeNSAID's and/or analgesics, corticosteriod injections, and activity modification.  Onset of symptoms was gradual, starting 2 years ago with gradually worsening course since that time. The patient noted no past surgery on the left knee(s).  Patient currently rates pain in the left knee(s) at 10 out of 10 with activity. Patient has night pain, worsening of pain with activity and weight bearing, pain that interferes with activities of daily living, and pain with passive range of motion.  Patient has evidence of periarticular osteophytes and joint space narrowing by imaging studies.  There is no active infection.  There are no problems to display for this patient.  Past Medical History:  Diagnosis Date   Aortic aneurysm (HCC)    Family history of breast cancer    GERD (gastroesophageal reflux disease)    H/O benign breast biopsy    High cholesterol    Hypertension     Past Surgical History:  Procedure Laterality Date   ABDOMINAL HYSTERECTOMY     due to leio/DUB   TONSILLECTOMY      Current Outpatient Medications  Medication Sig Dispense Refill Last Dose   acetaminophen (TYLENOL) 650 MG CR tablet Take 1,300 mg by mouth every 8 (eight) hours as needed for pain.      ALPRAZolam (XANAX) 0.25 MG tablet Take 0.25 mg by mouth at bedtime as needed for anxiety or sleep.      Evolocumab (REPATHA) 140 MG/ML SOSY Inject 140 mg into the skin every 14 (fourteen) days.      famotidine (PEPCID) 20 MG tablet Take 20 mg by mouth daily as needed for heartburn or indigestion.      ibuprofen (ADVIL) 200 MG tablet Take 400 mg by mouth every 6 (six) hours as needed for  moderate pain.      losartan-hydrochlorothiazide (HYZAAR) 50-12.5 MG tablet Take 1 tablet by mouth daily.      metoprolol succinate (TOPROL-XL) 25 MG 24 hr tablet Take 1 tablet by mouth daily.      naproxen sodium (ALEVE) 220 MG tablet Take 220-440 mg by mouth daily as needed (pain).      No current facility-administered medications for this visit.   Allergies  Allergen Reactions   Atorvastatin Calcium     Aches and pains   Docosahexaenoic Acid (Dha)     Unknown reaction   Ezetimibe     Unknown reaction   Niacin     Unknown reaction   Simvastatin     Aches and pains   Omeprazole Palpitations    Social History   Tobacco Use   Smoking status: Former   Smokeless tobacco: Never  Substance Use Topics   Alcohol use: Yes    Comment: rarely    Family History  Problem Relation Age of Onset   Breast cancer Mother 39     Review of Systems  Musculoskeletal:  Positive for arthralgias.  All other systems reviewed and are negative.   Objective:  Physical Exam Constitutional:      General: She is not in acute distress.    Appearance: Normal appearance. She is not ill-appearing.  HENT:     Head: Normocephalic and atraumatic.     Right Ear:   External ear normal.     Left Ear: External ear normal.     Nose: Nose normal.     Mouth/Throat:     Mouth: Mucous membranes are moist.     Pharynx: Oropharynx is clear.  Eyes:     Extraocular Movements: Extraocular movements intact.     Conjunctiva/sclera: Conjunctivae normal.  Cardiovascular:     Rate and Rhythm: Normal rate and regular rhythm.     Pulses: Normal pulses.     Heart sounds: Normal heart sounds.  Pulmonary:     Effort: Pulmonary effort is normal.     Breath sounds: Normal breath sounds.  Abdominal:     General: Bowel sounds are normal.     Palpations: Abdomen is soft.     Tenderness: There is no abdominal tenderness.  Musculoskeletal:        General: Tenderness present.     Cervical back: Normal range of motion  and neck supple.     Comments: Left knee TTP over medial joint line.  ROM 0-120 with elicited pain.  No swelling or calf tenderness.  No lesion over area of chief complaint.  BLE appear grossly neurovascularly intact.  Gait mildly antalgic.  Skin:    General: Skin is warm and dry.  Neurological:     Mental Status: She is alert and oriented to person, place, and time. Mental status is at baseline.  Psychiatric:        Mood and Affect: Mood normal.        Behavior: Behavior normal.     Vital signs in last 24 hours: @VSRANGES@  Labs:   Estimated body mass index is 31.32 kg/m as calculated from the following:   Height as of 01/10/21: 5' 9" (1.753 m).   Weight as of 01/10/21: 96.2 kg.   Imaging Review Plain radiographs demonstrate severe degenerative joint disease of the left knee(s).  The bone quality appears to be good for age and reported activity level.      Assessment/Plan:  End stage arthritis, left knee   The patient history, physical examination, clinical judgment of the provider and imaging studies are consistent with end stage degenerative joint disease of the left knee(s) and total knee arthroplasty is deemed medically necessary. The treatment options including medical management, injection therapy arthroscopy and arthroplasty were discussed at length. The risks and benefits of total knee arthroplasty were presented and reviewed. The risks due to aseptic loosening, infection, stiffness, patella tracking problems, thromboembolic complications and other imponderables were discussed. The patient acknowledged the explanation, agreed to proceed with the plan and consent was signed. Patient is being admitted for inpatient treatment for surgery, pain control, PT, OT, prophylactic antibiotics, VTE prophylaxis, progressive ambulation and ADL's and discharge planning. The patient is planning to be discharged home with outpatient PT.     Patient's anticipated LOS is less than 2  midnights, meeting these requirements: - Younger than 65 - Lives within 1 hour of care - Has a competent adult at home to recover with post-op recover - NO history of  - Chronic pain requiring opiods  - Diabetes  - Coronary Artery Disease  - Heart failure  - Heart attack  - Stroke  - DVT/VTE  - Cardiac arrhythmia  - Respiratory Failure/COPD  - Renal failure  - Anemia  - Advanced Liver disease   

## 2023-02-08 NOTE — Patient Instructions (Signed)
SURGICAL WAITING ROOM VISITATION  Patients having surgery or a procedure may have no more than 2 support people in the waiting area - these visitors may rotate.    Children under the age of 77 must have an adult with them who is not the patient.  Due to an increase in RSV and influenza rates and associated hospitalizations, children ages 6 and under may not visit patients in [facility] hospitals.  If the patient needs to stay at the hospital during part of their recovery, the visitor guidelines for inpatient rooms apply. Pre-op nurse will coordinate an appropriate time for 1 support person to accompany patient in pre-op.  This support person may not rotate.    Please refer to the [facility] website for the visitor guidelines for Inpatients (after your surgery is over and you are in a regular room).    Your procedure is scheduled on: 02/22/23   Report to [facility] Main Entrance    Report to admitting at 8:45 AM   Call this number if you have problems the morning of surgery [phone]   Do not eat food :After Midnight.   After Midnight you may have the following liquids until 8:15 AM DAY OF SURGERY  Water Non-Citrus Juices (without pulp, NO RED-Apple, White grape, White cranberry) Black Coffee (NO MILK/CREAM OR CREAMERS, sugar ok)  Clear Tea (NO MILK/CREAM OR CREAMERS, sugar ok) regular and decaf                             Plain Jell-O (NO RED)                                           Fruit ices (not with fruit pulp, NO RED)                                     Popsicles (NO RED)                                                               Sports drinks like Gatorade (NO RED)                 The day of surgery:  Drink ONE (1) Pre-Surgery Clear Ensure at 8:15 AM the morning of surgery. Drink in one sitting. Do not sip.  This drink was given to you during your hospital  pre-op appointment visit. Nothing else to drink after completing the  Pre-Surgery Clear  Ensure.          If you have questions, please contact your surgeon's office.   FOLLOW BOWEL PREP AND ANY ADDITIONAL PRE OP INSTRUCTIONS YOU RECEIVED FROM YOUR SURGEON'S OFFICE!!!     Oral Hygiene is also important to reduce your risk of infection.                                    Remember - BRUSH YOUR TEETH THE MORNING OF SURGERY WITH YOUR REGULAR TOOTHPASTE  DENTURES WILL  BE REMOVED PRIOR TO SURGERY PLEASE DO NOT APPLY "Poly grip" OR ADHESIVES!!!   Take these medicines the morning of surgery with A SIP OF WATER: Tylenol, Pepcid, Metoprolol                              You may not have any metal on your body including hair pins, jewelry, and body piercing             Do not wear make-up, lotions, powders, perfumes, or deodorant  Do not wear nail polish including gel and S&S, artificial/acrylic nails, or any other type of covering on natural nails including finger and toenails. If you have artificial nails, gel coating, etc. that needs to be removed by a nail salon please have this removed prior to surgery or surgery may need to be canceled/ delayed if the surgeon/ anesthesia feels like they are unable to be safely monitored.   Do not shave  48 hours prior to surgery.    Do not bring valuables to the hospital. [facility] IS NOT             RESPONSIBLE   FOR VALUABLES.   Contacts, glasses, dentures or bridgework may not be worn into surgery.  DO NOT BRING YOUR HOME MEDICATIONS TO THE HOSPITAL. PHARMACY WILL DISPENSE MEDICATIONS LISTED ON YOUR MEDICATION LIST TO YOU DURING YOUR ADMISSION IN THE HOSPITAL!    Patients discharged on the day of surgery will not be allowed to drive home.  Someone NEEDS to stay with you for the first 24 hours after anesthesia.   Special Instructions: Bring a copy of your healthcare power of attorney and living will documents the day of surgery if you haven't scanned them before.              Please read over the following fact sheets you were given: IF  YOU HAVE QUESTIONS ABOUT YOUR PRE-OP INSTRUCTIONS PLEASE CALL [phone] [facility]    If you received a COVID test during your pre-op visit  it is requested that you wear a mask when out in public, stay away from anyone that may not be feeling well and notify your surgeon if you develop symptoms. If you test positive for Covid or have been in contact with anyone that has tested positive in the last 10 days please notify you surgeon.      Pre-operative 5 CHG Bath Instructions   You can play a key role in reducing the risk of infection after surgery. Your skin needs to be as free of germs as possible. You can reduce the number of germs on your skin by washing with CHG (chlorhexidine gluconate) soap before surgery. CHG is an antiseptic soap that kills germs and continues to kill germs even after washing.   DO NOT use if you have an allergy to chlorhexidine/CHG or antibacterial soaps. If your skin becomes reddened or irritated, stop using the CHG and notify one of our RNs at [phone].   Please shower with the CHG soap starting 4 days before surgery using the following schedule:     Please keep in mind the following:  DO NOT shave, including legs and underarms, starting the day of your first shower.   You may shave your face at any point before/day of surgery.  Place clean sheets on your bed the day you start using CHG soap. Use a clean washcloth (not used since being washed) for each shower. DO  NOT sleep with pets once you start using the CHG.   CHG Shower Instructions:  If you choose to wash your hair and private area, wash first with your normal shampoo/soap.  After you use shampoo/soap, rinse your hair and body thoroughly to remove shampoo/soap residue.  Turn the water OFF and apply about 3 tablespoons (45 ml) of CHG soap to a CLEAN washcloth.  Apply CHG soap ONLY FROM YOUR NECK DOWN TO YOUR TOES (washing for 3-5 minutes)  DO NOT use CHG soap on face, private areas, open wounds, or  sores.  Pay special attention to the area where your surgery is being performed.  If you are having back surgery, having someone wash your back for you may be helpful. Wait 2 minutes after CHG soap is applied, then you may rinse off the CHG soap.  Pat dry with a clean towel  Put on clean clothes/pajamas   If you choose to wear lotion, please use ONLY the CHG-compatible lotions on the back of this paper.     Additional instructions for the day of surgery: DO NOT APPLY any lotions, deodorants, cologne, or perfumes.   Put on clean/comfortable clothes.  Brush your teeth.  Ask your nurse before applying any prescription medications to the skin.      CHG Compatible Lotions   Aveeno Moisturizing lotion  Cetaphil Moisturizing Cream  Cetaphil Moisturizing Lotion  Clairol Herbal Essence Moisturizing Lotion, Dry Skin  Clairol Herbal Essence Moisturizing Lotion, Extra Dry Skin  Clairol Herbal Essence Moisturizing Lotion, Normal Skin  Curel Age Defying Therapeutic Moisturizing Lotion with Alpha Hydroxy  Curel Extreme Care Body Lotion  Curel Soothing Hands Moisturizing Hand Lotion  Curel Therapeutic Moisturizing Cream, Fragrance-Free  Curel Therapeutic Moisturizing Lotion, Fragrance-Free  Curel Therapeutic Moisturizing Lotion, Original Formula  Eucerin Daily Replenishing Lotion  Eucerin Dry Skin Therapy Plus Alpha Hydroxy Crme  Eucerin Dry Skin Therapy Plus Alpha Hydroxy Lotion  Eucerin Original Crme  Eucerin Original Lotion  Eucerin Plus Crme Eucerin Plus Lotion  Eucerin TriLipid Replenishing Lotion  Keri Anti-Bacterial Hand Lotion  Keri Deep Conditioning Original Lotion Dry Skin Formula Softly Scented  Keri Deep Conditioning Original Lotion, Fragrance Free Sensitive Skin Formula  Keri Lotion Fast Absorbing Fragrance Free Sensitive Skin Formula  Keri Lotion Fast Absorbing Softly Scented Dry Skin Formula  Keri Original Lotion  Keri Skin Renewal Lotion Keri Silky Smooth Lotion  Keri  Silky Smooth Sensitive Skin Lotion  Nivea Body Creamy Conditioning Oil  Nivea Body Extra Enriched Lotion  Nivea Body Original Lotion  Nivea Body Sheer Moisturizing Lotion Nivea Crme  Nivea Skin Firming Lotion  NutraDerm 30 Skin Lotion  NutraDerm Skin Lotion  NutraDerm Therapeutic Skin Cream  NutraDerm Therapeutic Skin Lotion  ProShield Protective Hand Cream  Provon moisturizing lotion   Incentive Spirometer  An incentive spirometer is a tool that can help keep your lungs clear and active. This tool measures how well you are filling your lungs with each breath. Taking long deep breaths may help reverse or decrease the chance of developing breathing (pulmonary) problems (especially infection) following: A long period of time when you are unable to move or be active. BEFORE THE PROCEDURE  If the spirometer includes an indicator to show your best effort, your nurse or respiratory therapist will set it to a desired goal. If possible, sit up straight or lean slightly forward. Try not to slouch. Hold the incentive spirometer in an upright position. INSTRUCTIONS FOR USE  Sit on the edge of your bed if  possible, or sit up as far as you can in bed or on a chair. Hold the incentive spirometer in an upright position. Breathe out normally. Place the mouthpiece in your mouth and seal your lips tightly around it. Breathe in slowly and as deeply as possible, raising the piston or the ball toward the top of the column. Hold your breath for 3-5 seconds or for as long as possible. Allow the piston or ball to fall to the bottom of the column. Remove the mouthpiece from your mouth and breathe out normally. Rest for a few seconds and repeat Steps 1 through 7 at least 10 times every 1-2 hours when you are awake. Take your time and take a few normal breaths between deep breaths. The spirometer may include an indicator to show your best effort. Use the indicator as a goal to work toward during each  repetition. After each set of 10 deep breaths, practice coughing to be sure your lungs are clear. If you have an incision (the cut made at the time of surgery), support your incision when coughing by placing a pillow or rolled up towels firmly against it. Once you are able to get out of bed, walk around indoors and cough well. You may stop using the incentive spirometer when instructed by your caregiver.  RISKS AND COMPLICATIONS Take your time so you do not get dizzy or light-headed. If you are in pain, you may need to take or ask for pain medication before doing incentive spirometry. It is harder to take a deep breath if you are having pain. AFTER USE Rest and breathe slowly and easily. It can be helpful to keep track of a log of your progress. Your caregiver can provide you with a simple table to help with this. If you are using the spirometer at home, follow these instructions: SEEK MEDICAL CARE IF:  You are having difficultly using the spirometer. You have trouble using the spirometer as often as instructed. Your pain medication is not giving enough relief while using the spirometer. You develop fever of 100.5 F (38.1 C) or higher. SEEK IMMEDIATE MEDICAL CARE IF:  You cough up bloody sputum that had not been present before. You develop fever of 102 F (38.9 C) or greater. You develop worsening pain at or near the incision site. MAKE SURE YOU:  Understand these instructions. Will watch your condition. Will get help right away if you are not doing well or get worse. Document Released: 01/09/2007 Document Revised: 11/21/2011 Document Reviewed: 03/12/2007 Exit[employer] Patient Information 2014 [employer], [place].   ________________________________________________________________________   WHAT IS A BLOOD TRANSFUSION? Blood Transfusion Information  A transfusion is the replacement of blood or some of its parts. Blood is made up of multiple cells which provide different functions. Red blood  cells carry oxygen and are used for blood loss replacement. White blood cells fight against infection. Platelets control bleeding. Plasma helps clot blood. Other blood products are available for specialized needs, such as hemophilia or other clotting disorders. BEFORE THE TRANSFUSION  Who gives blood for transfusions?  Healthy volunteers who are fully evaluated to make sure their blood is safe. This is blood bank blood. Transfusion therapy is the safest it has ever been in the practice of medicine. Before blood is taken from a donor, a complete history is taken to make sure that person has no history of diseases nor engages in risky social behavior (examples are intravenous drug use or sexual activity with multiple partners). The donor's travel history  is screened to minimize risk of transmitting infections, such as malaria. The donated blood is tested for signs of infectious diseases, such as HIV and hepatitis. The blood is then tested to be sure it is compatible with you in order to minimize the chance of a transfusion reaction. If you or a relative donates blood, this is often done in anticipation of surgery and is not appropriate for emergency situations. It takes many days to process the donated blood. RISKS AND COMPLICATIONS Although transfusion therapy is very safe and saves many lives, the main dangers of transfusion include:  Getting an infectious disease. Developing a transfusion reaction. This is an allergic reaction to something in the blood you were given. Every precaution is taken to prevent this. The decision to have a blood transfusion has been considered carefully by your caregiver before blood is given. Blood is not given unless the benefits outweigh the risks. AFTER THE TRANSFUSION Right after receiving a blood transfusion, you will usually feel much better and more energetic. This is especially true if your red blood cells have gotten low (anemic). The transfusion raises the level  of the red blood cells which carry oxygen, and this usually causes an energy increase. The nurse administering the transfusion will monitor you carefully for complications. HOME CARE INSTRUCTIONS  No special instructions are needed after a transfusion. You may find your energy is better. Speak with your caregiver about any limitations on activity for underlying diseases you may have. SEEK MEDICAL CARE IF:  Your condition is not improving after your transfusion. You develop redness or irritation at the intravenous (IV) site. SEEK IMMEDIATE MEDICAL CARE IF:  Any of the following symptoms occur over the next 12 hours: Shaking chills. You have a temperature by mouth above 102 F (38.9 C), not controlled by medicine. Chest, back, or muscle pain. People around you feel you are not acting correctly or are confused. Shortness of breath or difficulty breathing. Dizziness and fainting. You get a rash or develop hives. You have a decrease in urine output. Your urine turns a dark color or changes to pink, red, or brown. Any of the following symptoms occur over the next 10 days: You have a temperature by mouth above 102 F (38.9 C), not controlled by medicine. Shortness of breath. Weakness after normal activity. The white part of the eye turns yellow (jaundice). You have a decrease in the amount of urine or are urinating less often. Your urine turns a dark color or changes to pink, red, or brown. Document Released: 08/26/2000 Document Revised: 11/21/2011 Document Reviewed: 04/14/2008 [facility] Patient Information 2014 [place], [place].  _______________________________________________________________________

## 2023-02-08 NOTE — Progress Notes (Signed)
COVID Vaccine Completed:yes  Date of COVID positive in last 90 days:  PCP -  Cardiologist -   Chest x-ray -  EKG -  Stress Test -  ECHO - 08/24/20 [facility] Cardiac Cath -  Pacemaker/ICD device last checked: Spinal Cord Stimulator:  Bowel Prep -   Sleep Study -  CPAP -   Fasting Blood Sugar -  Checks Blood Sugar _____ times a day  Last dose of GLP1 agonist-  N/A GLP1 instructions:  N/A   Last dose of SGLT-2 inhibitors-  N/A SGLT-2 instructions: N/A   Blood Thinner Instructions:  Time Aspirin Instructions: Last Dose:  Activity level:  Can go up a flight of stairs and perform activities of daily living without stopping and without symptoms of chest pain or shortness of breath.  Able to exercise without symptoms  Unable to go up a flight of stairs without symptoms of     Anesthesia review:   Patient denies shortness of breath, fever, cough and chest pain at PAT appointment  Patient verbalized understanding of instructions that were given to them at the PAT appointment. Patient was also instructed that they will need to review over the PAT instructions again at home before surgery.

## 2023-02-09 ENCOUNTER — Other Ambulatory Visit: Payer: Self-pay

## 2023-02-09 ENCOUNTER — Encounter (HOSPITAL_COMMUNITY)
Admission: RE | Admit: 2023-02-09 | Discharge: 2023-02-09 | Disposition: A | Discharge status: Home / Self Care | Payer: Medicare Other | Source: Ambulatory Visit | Attending: Orthopedic Surgery | Admitting: Orthopedic Surgery

## 2023-02-09 ENCOUNTER — Encounter (HOSPITAL_COMMUNITY): Payer: Self-pay

## 2023-02-09 VITALS — BP 125/89 | HR 74 | Temp 98.5°F | Resp 16 | Ht 69.0 in | Wt 210.8 lb

## 2023-02-09 DIAGNOSIS — G8929 Other chronic pain: Secondary | ICD-10-CM | POA: Diagnosis not present

## 2023-02-09 DIAGNOSIS — Z01818 Encounter for other preprocedural examination: Secondary | ICD-10-CM | POA: Diagnosis present

## 2023-02-09 DIAGNOSIS — Z87891 Personal history of nicotine dependence: Secondary | ICD-10-CM | POA: Diagnosis not present

## 2023-02-09 DIAGNOSIS — M1712 Unilateral primary osteoarthritis, left knee: Secondary | ICD-10-CM | POA: Insufficient documentation

## 2023-02-09 DIAGNOSIS — K219 Gastro-esophageal reflux disease without esophagitis: Secondary | ICD-10-CM | POA: Insufficient documentation

## 2023-02-09 DIAGNOSIS — I350 Nonrheumatic aortic (valve) stenosis: Secondary | ICD-10-CM | POA: Insufficient documentation

## 2023-02-09 DIAGNOSIS — M25562 Pain in left knee: Secondary | ICD-10-CM | POA: Diagnosis not present

## 2023-02-09 DIAGNOSIS — I10 Essential (primary) hypertension: Secondary | ICD-10-CM | POA: Insufficient documentation

## 2023-02-09 HISTORY — DX: Anxiety disorder, unspecified: F41.9

## 2023-02-09 HISTORY — DX: Unspecified osteoarthritis, unspecified site: M19.90

## 2023-02-09 LAB — CBC WITH DIFFERENTIAL/PLATELET
Abs Immature Granulocytes: 0.03 K/uL (ref 0.00–0.07)
Basophils Absolute: 0.1 K/uL (ref 0.0–0.1)
Basophils Relative: 1 %
Eosinophils Absolute: 0.2 K/uL (ref 0.0–0.5)
Eosinophils Relative: 3 %
HCT: 40.6 % (ref 36.0–46.0)
Hemoglobin: 13.6 g/dL (ref 12.0–15.0)
Immature Granulocytes: 0 %
Lymphocytes Relative: 27 %
Lymphs Abs: 2.1 K/uL (ref 0.7–4.0)
MCH: 28.9 pg (ref 26.0–34.0)
MCHC: 33.5 g/dL (ref 30.0–36.0)
MCV: 86.4 fL (ref 80.0–100.0)
Monocytes Absolute: 0.8 K/uL (ref 0.1–1.0)
Monocytes Relative: 10 %
Neutro Abs: 4.7 K/uL (ref 1.7–7.7)
Neutrophils Relative %: 59 %
Platelets: 292 K/uL (ref 150–400)
RBC: 4.7 MIL/uL (ref 3.87–5.11)
RDW: 14 % (ref 11.5–15.5)
WBC: 7.9 K/uL (ref 4.0–10.5)
nRBC: 0 % (ref 0.0–0.2)

## 2023-02-09 LAB — COMPREHENSIVE METABOLIC PANEL
ALT: 17 U/L (ref 0–44)
AST: 18 U/L (ref 15–41)
Albumin: 4.1 g/dL (ref 3.5–5.0)
Alkaline Phosphatase: 50 U/L (ref 38–126)
Anion gap: 10 (ref 5–15)
BUN: 19 mg/dL (ref 8–23)
CO2: 22 mmol/L (ref 22–32)
Calcium: 9.4 mg/dL (ref 8.9–10.3)
Chloride: 106 mmol/L (ref 98–111)
Creatinine, Ser: 0.83 mg/dL (ref 0.44–1.00)
GFR, Estimated: >60 mL/min (ref >60)
Glucose, Bld: 105 mg/dL — ABNORMAL HIGH (ref 70–99)
Potassium: 3.6 mmol/L (ref 3.5–5.1)
Sodium: 138 mmol/L (ref 135–145)
Total Bilirubin: 0.8 mg/dL (ref 0.3–1.2)
Total Protein: 8.3 g/dL — ABNORMAL HIGH (ref 6.5–8.1)

## 2023-02-09 LAB — TYPE AND SCREEN
ABO/RH(D): O POS
Antibody Screen: NEGATIVE

## 2023-02-09 LAB — SURGICAL PCR SCREEN
MRSA, PCR: NEGATIVE
Staphylococcus aureus: NEGATIVE

## 2023-02-14 ENCOUNTER — Encounter (HOSPITAL_COMMUNITY): Payer: Self-pay

## 2023-02-14 NOTE — Anesthesia Preprocedure Evaluation (Addendum)
Anesthesia Evaluation  Patient identified by MRN, date of birth, ID band Patient awake    Reviewed: Allergy & Precautions, H&P , NPO status , Patient's Chart, lab work & pertinent test results  Airway Mallampati: II  TM Distance: >3 FB Neck ROM: Full    Dental no notable dental hx.    Pulmonary neg pulmonary ROS, former smoker   Pulmonary exam normal breath sounds clear to auscultation       Cardiovascular hypertension, Pt. on medications negative cardio ROS Normal cardiovascular exam Rhythm:Regular Rate:Normal     Neuro/Psych   Anxiety     negative neurological ROS  negative psych ROS   GI/Hepatic Neg liver ROS,GERD  ,,  Endo/Other  negative endocrine ROS    Renal/GU negative Renal ROS  negative genitourinary   Musculoskeletal  (+) Arthritis , Osteoarthritis,    Abdominal  (+) + obese  Peds negative pediatric ROS (+)  Hematology negative hematology ROS (+)   Anesthesia Other Findings   Reproductive/Obstetrics negative OB ROS                             Anesthesia Physical Anesthesia Plan  ASA: 2  Anesthesia Plan: Spinal   Post-op Pain Management: Regional block*   Induction: Intravenous  PONV Risk Score and Plan: 2 and Ondansetron, Midazolam and Treatment may vary due to age or medical condition  Airway Management Planned: Simple Face Mask  Additional Equipment:   Intra-op Plan:   Post-operative Plan:   Informed Consent: I have reviewed the patients History and Physical, chart, labs and discussed the procedure including the risks, benefits and alternatives for the proposed anesthesia with the patient or authorized representative who has indicated his/her understanding and acceptance.     Dental advisory given  Plan Discussed with: CRNA  Anesthesia Plan Comments: (See PAT note from 5/30 by Sherlie Ban PA-C )        Anesthesia Quick Evaluation

## 2023-02-14 NOTE — Progress Notes (Signed)
Case: [id number] Date/Time: 02/22/23 1100   Procedure: TOTAL KNEE ARTHROPLASTY (Left: Knee)   Anesthesia type: Spinal   Pre-op diagnosis: OA LEFT KNEE   Location: [facility] 09 / [facility]   Surgeons: Joen Laura, MD       DISCUSSION: Mariah Holt is a 72 year old female who presents to PAT prior to surgery listed above.  Past medical history significant for former smoking, hypertension, GERD, aortic aneurysm, mild aortic stenosis.  No prior anesthesia complications  Patient last saw her PCP on 12/26/2022 for preoperative risk assessment and clearance.  Per her PCP at that visit "the patient does not have any contraindication for proposed procedure.  She will be cleared for proposed procedure." (Paper copy in chart)  Patient saw cardiology for clearance on 12/23/2022.  She does not have any history of coronary artery disease.  Patient previously had palpitations which was thought to be due to hypokalemia.  Potassium was repleted and she has not had recurrence of this.  Per cardiology notes: "significant arrhythmias have been ruled out with an event monitor." Preoperative risk assessment was completed:  "Patient presents prior to total knee replacement surgery.  She cannot currently endorse significant physical activity due to knee pain, thus I cannot adequately determine her functional capacity.  Will risk stratify with nuclear stress test prior to surgery.  If stable, she may proceed without any further cardiac testing.  Perioperative monitoring of blood pressure is strongly recommended." (Paper copy in chart)  Patient subsequently completed nuclear stress test on 5/1 which did not show any evidence of stress-induced ischemia.  Patient also follows with cardiothoracic surgery for monitoring of her ascending aortic aneurysm.  She was last seen on 06/01/2021.  She was noted to have a dilated ascending aorta measuring up to 4.2 cm which was stable from prior study.  Advised to follow-up in 2  years with for repeat CTA scan (~Sept 2024).   VS: BP 125/89   Pulse 74   Temp 36.9 C (Oral)   Resp 16   Ht 5\' 9"  (1.753 m)   Wt 95.6 kg   SpO2 98%   BMI 31.13 kg/m   PROVIDERS: PCP - Dr. Kathlene November [facility] ph [phone] Cardiologist - Dr. Baird Cancer same clinic as PCP Cardiothoracic surgery - Stoney Bang, MD   LABS: Labs reviewed: Acceptable for surgery. (all labs ordered are listed, but only abnormal results are displayed)  Labs Reviewed  COMPREHENSIVE METABOLIC PANEL - Abnormal; Notable for the following components:      Result Value   Glucose, Bld 105 (*)    Total Protein 8.3 (*)    All other components within normal limits  SURGICAL PCR SCREEN  CBC WITH DIFFERENTIAL/PLATELET  TYPE AND SCREEN     IMAGES:  CTA Chest/Abdomen/Pelvis 06/01/2021:  IMPRESSION:   Ascending aorta measuring 4.2 cm at the level of the right main pulmonary artery. 3-D report to follow.   Dilated esophagus with soft tissue thickening at the GE junction. Further evaluation with upper endoscopy is recommended.    EKG 12/23/22:  Sinus rhythm, voltage criteria for LVH is met   CV:  NM Stress test 01/11/23:  Findings: Study was technically adequate Post stress images revealed homogenous uptake by the left ventricular myocardium Resting images reveal no changes suggestive of reversibility, prone images are superior to the supine images Left ventricular cavitary size appears to show no evidence of stress-induced cavitary dilatation, with a 3 times daily index of 1.04 Wall motion studies  were obtained during SPECT acquisition and revealed no abnormality with an overall global ejection fraction equal to 82% Right ventricle is normal in size  Conclusions Normal pharmacologic stress perfusion study Normal left ventricular systolic function No evidence of stress-induced ischemia  Echo 04/21/2022 (per report from Cardiology OV notes):  EF 60% Aortic valve  thickening and mild aortic stenosis, trace AI, MAC noted, mild MR  Echo 09/03/2020:  FINDINGS   1. Left Ventricle: Normal left ventricular cavity size. Normal left    ventricular wall thickness. EF estimated at 55-60%. Normal regional    wall motion. Abnormal diastolic filling pattern for age. Impaired    relaxation filling pattern for age.     2. Right Ventricle: Normal right ventricular size. Normal right    ventricular global systolic function.    3. Left Atrium: Mildly dilated left atrium.     4. Right Atrium: Normal right atrial size.    5. Interatrial Septum: No patent foramen ovale present by color    Doppler.    6. Mitral Valve: Moderate mitral annular calcification.  No mitral    stenosis. Trace mitral regurgitation.    7. Aortic Valve: Focal thickening of the aortic valve cusps.  Aortic    sclerosis No aortic regurgitation noted.    8. Tricuspid Valve: Normally structured tricuspid valve. No    tricuspid stenosis. Trace tricuspid regurgitation noted. Unable to    estimate RVSP due to inadequate Doppler signal.     9. Pulmonic Valve: Structurally normal pulmonic valve. No    pulmonic stenosis. Trace pulmonary regurgitation.    10. Masses and Vegetations: No masses or vegetations noted.    11. Aorta: Aortic root is normal in size.  Ascending aorta is normal    in size.  Normal aortic arch.  No Doppler evidence of coarctation.    12. Pericardium: No pericardial effusion.     Holter monitor 08/25/2016 Event monitor report from 8/23 to 05/18/2022 was reviewed in detail.  Average heart rate was 70 bpm.  Patient had 13 asymptomatic episodes of SVT with the longest lasting for 13 seconds with average heart rate of 95 bpm.  She was symptomatic with sinus rhythm and ectopy.  Carotid duplex 02/10/2022 (per reports from Cardiology OV notes) -no stenosis  Past Medical History:  Diagnosis Date   Anxiety    Aortic aneurysm (HCC)    Arthritis    Family history of breast cancer    GERD  (gastroesophageal reflux disease)    H/O benign breast biopsy    High cholesterol    Hypertension     Past Surgical History:  Procedure Laterality Date   ABDOMINAL HYSTERECTOMY     due to leio/DUB   TONSILLECTOMY      MEDICATIONS:  acetaminophen (TYLENOL) 650 MG CR tablet   ALPRAZolam (XANAX) 0.25 MG tablet   Evolocumab (REPATHA) 140 MG/ML SOSY   famotidine (PEPCID) 20 MG tablet   ibuprofen (ADVIL) 200 MG tablet   losartan-hydrochlorothiazide (HYZAAR) 50-12.5 MG tablet   metoprolol succinate (TOPROL-XL) 25 MG 24 hr tablet   naproxen sodium (ALEVE) 220 MG tablet   No current facility-administered medications for this encounter.   Marcille Blanco [facility] Surgical Short Stay/Anesthesiology [facility] Phone [phone] 02/14/2023 2:59 PM

## 2023-02-16 ENCOUNTER — Other Ambulatory Visit (HOSPITAL_COMMUNITY): Payer: Self-pay | Admitting: Cardiothoracic Surgery

## 2023-02-16 DIAGNOSIS — Z1231 Encounter for screening mammogram for malignant neoplasm of breast: Secondary | ICD-10-CM

## 2023-02-20 ENCOUNTER — Encounter (HOSPITAL_COMMUNITY): Payer: Self-pay

## 2023-02-20 ENCOUNTER — Other Ambulatory Visit (HOSPITAL_COMMUNITY): Payer: Self-pay

## 2023-02-20 ENCOUNTER — Inpatient Hospital Stay (HOSPITAL_COMMUNITY): Admission: RE | Admit: 2023-02-20 | Payer: Medicare Other | Source: Ambulatory Visit

## 2023-02-20 ENCOUNTER — Ambulatory Visit (HOSPITAL_COMMUNITY)
Admission: RE | Admit: 2023-02-20 | Discharge: 2023-02-20 | Disposition: A | Discharge status: Home / Self Care | Payer: Medicare Other | Source: Ambulatory Visit

## 2023-02-20 DIAGNOSIS — Z1231 Encounter for screening mammogram for malignant neoplasm of breast: Secondary | ICD-10-CM | POA: Diagnosis present

## 2023-02-22 ENCOUNTER — Other Ambulatory Visit: Payer: Self-pay

## 2023-02-22 ENCOUNTER — Ambulatory Visit (HOSPITAL_BASED_OUTPATIENT_CLINIC_OR_DEPARTMENT_OTHER): Payer: Medicare Other | Admitting: Certified Registered Nurse Anesthetist

## 2023-02-22 ENCOUNTER — Ambulatory Visit (HOSPITAL_COMMUNITY): Payer: Medicare Other

## 2023-02-22 ENCOUNTER — Encounter (HOSPITAL_COMMUNITY): Payer: Self-pay | Admitting: Orthopedic Surgery

## 2023-02-22 ENCOUNTER — Ambulatory Visit (HOSPITAL_COMMUNITY): Payer: Medicare Other | Admitting: Physician Assistant

## 2023-02-22 ENCOUNTER — Observation Stay (HOSPITAL_COMMUNITY)
Admission: RE | Admit: 2023-02-22 | Discharge: 2023-02-23 | Disposition: A | Discharge status: Home / Self Care | Payer: Medicare Other | Source: Ambulatory Visit | Attending: Orthopedic Surgery | Admitting: Orthopedic Surgery

## 2023-02-22 ENCOUNTER — Encounter (HOSPITAL_COMMUNITY)
Admission: RE | Disposition: A | Discharge status: Home / Self Care | Payer: Self-pay | Source: Ambulatory Visit | Attending: Orthopedic Surgery

## 2023-02-22 DIAGNOSIS — I10 Essential (primary) hypertension: Secondary | ICD-10-CM | POA: Diagnosis not present

## 2023-02-22 DIAGNOSIS — F419 Anxiety disorder, unspecified: Secondary | ICD-10-CM | POA: Diagnosis not present

## 2023-02-22 DIAGNOSIS — Z87891 Personal history of nicotine dependence: Secondary | ICD-10-CM | POA: Diagnosis not present

## 2023-02-22 DIAGNOSIS — Z79899 Other long term (current) drug therapy: Secondary | ICD-10-CM | POA: Diagnosis not present

## 2023-02-22 DIAGNOSIS — M1712 Unilateral primary osteoarthritis, left knee: Principal | ICD-10-CM | POA: Insufficient documentation

## 2023-02-22 DIAGNOSIS — Z96652 Presence of left artificial knee joint: Secondary | ICD-10-CM

## 2023-02-22 HISTORY — PX: TOTAL KNEE ARTHROPLASTY: SHX125

## 2023-02-22 LAB — ABO/RH: ABO/RH(D): O POS

## 2023-02-22 SURGERY — ARTHROPLASTY, KNEE, TOTAL
Anesthesia: Spinal | Site: Knee | Laterality: Left

## 2023-02-22 MED ORDER — SODIUM CHLORIDE 0.9% FLUSH
INTRAVENOUS | Status: DC | PRN
  Administered 2023-02-22: 60 mL

## 2023-02-22 MED ORDER — PROPOFOL 500 MG/50ML IV EMUL
INTRAVENOUS | Status: DC | PRN
  Administered 2023-02-22: 90 ug/kg/min via INTRAVENOUS

## 2023-02-22 MED ORDER — POLYETHYLENE GLYCOL 3350 17 G PO PACK: 17.0000 g | PACK | Freq: Every day | ORAL | 0 refills | Status: AC

## 2023-02-22 MED ORDER — ACETAMINOPHEN 500 MG PO TABS
1000.0000 mg | ORAL_TABLET | Freq: Four times a day (QID) | ORAL | Status: AC
  Administered 2023-02-22 – 2023-02-23 (×4): 1000 mg via ORAL
  Filled 2023-02-22 (×3): qty 2

## 2023-02-22 MED ORDER — METHOCARBAMOL 500 MG IVPB - SIMPLE MED
INTRAVENOUS | Status: AC
  Filled 2023-02-22: qty 55

## 2023-02-22 MED ORDER — HYDROMORPHONE HCL 1 MG/ML IJ SOLN: 0.5000 mg | INTRAMUSCULAR | Status: DC | PRN

## 2023-02-22 MED ORDER — ACETAMINOPHEN 500 MG PO TABS: 1000.0000 mg | ORAL_TABLET | Freq: Once | ORAL | Status: DC

## 2023-02-22 MED ORDER — METHOCARBAMOL 500 MG IVPB - SIMPLE MED
500.0000 mg | Freq: Four times a day (QID) | INTRAVENOUS | Status: DC | PRN
  Administered 2023-02-22: 500 mg via INTRAVENOUS
  Filled 2023-02-22: qty 55

## 2023-02-22 MED ORDER — METHOCARBAMOL 500 MG PO TABS: 500.0000 mg | ORAL_TABLET | Freq: Four times a day (QID) | ORAL | Status: DC | PRN

## 2023-02-22 MED ORDER — KETOROLAC TROMETHAMINE 15 MG/ML IJ SOLN
INTRAMUSCULAR | Status: AC
  Filled 2023-02-22: qty 1

## 2023-02-22 MED ORDER — BUPIVACAINE LIPOSOME 1.3 % IJ SUSP
INTRAMUSCULAR | Status: DC | PRN
  Administered 2023-02-22: 20 mL

## 2023-02-22 MED ORDER — LACTATED RINGERS IV SOLN: INTRAVENOUS | Status: DC

## 2023-02-22 MED ORDER — BUPIVACAINE HCL (PF) 0.75 % IJ SOLN
INTRAMUSCULAR | Status: DC | PRN
  Administered 2023-02-22: 1.6 mL

## 2023-02-22 MED ORDER — MIDAZOLAM HCL 2 MG/2ML IJ SOLN
1.0000 mg | INTRAMUSCULAR | Status: DC
  Administered 2023-02-22: 2 mg via INTRAVENOUS
  Filled 2023-02-22: qty 2

## 2023-02-22 MED ORDER — LACTATED RINGERS IV BOLUS
500.0000 mL | Freq: Once | INTRAVENOUS | Status: AC
  Administered 2023-02-22: 500 mL via INTRAVENOUS

## 2023-02-22 MED ORDER — LACTATED RINGERS IV BOLUS: 250.0000 mL | Freq: Once | INTRAVENOUS | Status: DC

## 2023-02-22 MED ORDER — ROPIVACAINE HCL 5 MG/ML IJ SOLN
INTRAMUSCULAR | Status: DC | PRN
  Administered 2023-02-22: 20 mL via PERINEURAL

## 2023-02-22 MED ORDER — ONDANSETRON HCL 4 MG PO TABS
4.0000 mg | ORAL_TABLET | Freq: Four times a day (QID) | ORAL | Status: DC | PRN
  Filled 2023-02-22: qty 1

## 2023-02-22 MED ORDER — LOSARTAN POTASSIUM 50 MG PO TABS
50.0000 mg | ORAL_TABLET | Freq: Every day | ORAL | Status: DC
  Filled 2023-02-22: qty 1

## 2023-02-22 MED ORDER — ACETAMINOPHEN 325 MG PO TABS: 325.0000 mg | ORAL_TABLET | Freq: Four times a day (QID) | ORAL | Status: DC | PRN

## 2023-02-22 MED ORDER — LOSARTAN POTASSIUM-HCTZ 50-12.5 MG PO TABS: 1.0000 | ORAL_TABLET | Freq: Every day | ORAL | Status: DC

## 2023-02-22 MED ORDER — SODIUM CHLORIDE (PF) 0.9 % IJ SOLN
INTRAMUSCULAR | Status: AC
  Filled 2023-02-22: qty 10

## 2023-02-22 MED ORDER — CEFAZOLIN SODIUM-DEXTROSE 2-4 GM/100ML-% IV SOLN
2.0000 g | Freq: Four times a day (QID) | INTRAVENOUS | Status: AC
  Administered 2023-02-22 (×2): 2 g via INTRAVENOUS
  Filled 2023-02-22: qty 100

## 2023-02-22 MED ORDER — TRANEXAMIC ACID-NACL 1000-0.7 MG/100ML-% IV SOLN
1000.0000 mg | INTRAVENOUS | Status: AC
  Administered 2023-02-22: 1000 mg via INTRAVENOUS
  Filled 2023-02-22: qty 100

## 2023-02-22 MED ORDER — PROMETHAZINE HCL 25 MG/ML IJ SOLN: 6.2500 mg | INTRAMUSCULAR | Status: DC | PRN

## 2023-02-22 MED ORDER — OXYCODONE HCL 5 MG PO TABS
5.0000 mg | ORAL_TABLET | Freq: Once | ORAL | Status: AC | PRN
  Administered 2023-02-22: 5 mg via ORAL

## 2023-02-22 MED ORDER — WATER FOR IRRIGATION, STERILE IR SOLN
Status: DC | PRN
  Administered 2023-02-22: 2000 mL

## 2023-02-22 MED ORDER — OXYCODONE HCL 5 MG PO TABS
ORAL_TABLET | ORAL | Status: AC
  Filled 2023-02-22: qty 1

## 2023-02-22 MED ORDER — KETOROLAC TROMETHAMINE 15 MG/ML IJ SOLN
7.5000 mg | Freq: Four times a day (QID) | INTRAMUSCULAR | Status: DC
  Administered 2023-02-22 – 2023-02-23 (×3): 7.5 mg via INTRAVENOUS
  Filled 2023-02-22 (×2): qty 1

## 2023-02-22 MED ORDER — POLYETHYLENE GLYCOL 3350 17 G PO PACK: 17.0000 g | PACK | Freq: Every day | ORAL | Status: DC | PRN

## 2023-02-22 MED ORDER — SODIUM CHLORIDE 0.9 % IV SOLN: INTRAVENOUS | Status: DC

## 2023-02-22 MED ORDER — MENTHOL 3 MG MT LOZG: 1.0000 | LOZENGE | OROMUCOSAL | Status: DC | PRN

## 2023-02-22 MED ORDER — ACETAMINOPHEN 500 MG PO TABS: 1000.0000 mg | ORAL_TABLET | Freq: Three times a day (TID) | ORAL | 0 refills | Status: DC | PRN

## 2023-02-22 MED ORDER — 0.9 % SODIUM CHLORIDE (POUR BTL) OPTIME
TOPICAL | Status: DC | PRN
  Administered 2023-02-22 (×2): 1000 mL

## 2023-02-22 MED ORDER — OXYCODONE HCL 5 MG PO TABS
5.0000 mg | ORAL_TABLET | ORAL | Status: DC | PRN
  Filled 2023-02-22 (×2): qty 2

## 2023-02-22 MED ORDER — HYDROMORPHONE HCL 1 MG/ML IJ SOLN
INTRAMUSCULAR | Status: AC
  Filled 2023-02-22: qty 1

## 2023-02-22 MED ORDER — METHOCARBAMOL 500 MG PO TABS: 500.0000 mg | ORAL_TABLET | Freq: Three times a day (TID) | ORAL | 0 refills | Status: AC | PRN

## 2023-02-22 MED ORDER — PHENYLEPHRINE HCL-NACL 20-0.9 MG/250ML-% IV SOLN
INTRAVENOUS | Status: DC | PRN
  Administered 2023-02-22: 40 ug/min via INTRAVENOUS

## 2023-02-22 MED ORDER — OXYCODONE HCL 5 MG PO TABS: 5.0000 mg | ORAL_TABLET | ORAL | 0 refills | Status: DC | PRN

## 2023-02-22 MED ORDER — PANTOPRAZOLE SODIUM 40 MG PO TBEC: 40.0000 mg | DELAYED_RELEASE_TABLET | Freq: Every day | ORAL | 0 refills | Status: AC

## 2023-02-22 MED ORDER — METOPROLOL SUCCINATE ER 25 MG PO TB24
25.0000 mg | ORAL_TABLET | Freq: Every day | ORAL | Status: DC
  Administered 2023-02-23: 25 mg via ORAL
  Filled 2023-02-22: qty 1

## 2023-02-22 MED ORDER — ONDANSETRON HCL 4 MG/2ML IJ SOLN
INTRAMUSCULAR | Status: DC | PRN
  Administered 2023-02-22: 4 mg via INTRAVENOUS

## 2023-02-22 MED ORDER — DEXAMETHASONE SODIUM PHOSPHATE 10 MG/ML IJ SOLN
8.0000 mg | Freq: Once | INTRAMUSCULAR | Status: AC
  Administered 2023-02-22: 8 mg via INTRAVENOUS

## 2023-02-22 MED ORDER — HYDROMORPHONE HCL 1 MG/ML IJ SOLN
0.2500 mg | INTRAMUSCULAR | Status: DC | PRN
  Administered 2023-02-22 (×2): 0.5 mg via INTRAVENOUS

## 2023-02-22 MED ORDER — OXYCODONE HCL 5 MG/5ML PO SOLN: 5.0000 mg | Freq: Once | ORAL | Status: AC | PRN

## 2023-02-22 MED ORDER — BUPIVACAINE LIPOSOME 1.3 % IJ SUSP
INTRAMUSCULAR | Status: AC
  Filled 2023-02-22: qty 20

## 2023-02-22 MED ORDER — PROPOFOL 10 MG/ML IV BOLUS
INTRAVENOUS | Status: AC
  Filled 2023-02-22: qty 20

## 2023-02-22 MED ORDER — PANTOPRAZOLE SODIUM 40 MG PO TBEC
40.0000 mg | DELAYED_RELEASE_TABLET | Freq: Every day | ORAL | Status: DC
  Administered 2023-02-23: 40 mg via ORAL
  Filled 2023-02-22: qty 1

## 2023-02-22 MED ORDER — ASPIRIN 81 MG PO TBEC: 81.0000 mg | DELAYED_RELEASE_TABLET | Freq: Two times a day (BID) | ORAL | 0 refills | Status: AC

## 2023-02-22 MED ORDER — HYDROCHLOROTHIAZIDE 12.5 MG PO TABS
12.5000 mg | ORAL_TABLET | Freq: Every day | ORAL | Status: DC
  Filled 2023-02-22: qty 1

## 2023-02-22 MED ORDER — ISOPROPYL ALCOHOL 70 % SOLN
Status: DC | PRN
  Administered 2023-02-22: 1 via TOPICAL

## 2023-02-22 MED ORDER — LIDOCAINE HCL (PF) 2 % IJ SOLN
INTRAMUSCULAR | Status: AC
  Filled 2023-02-22: qty 5

## 2023-02-22 MED ORDER — ASPIRIN 81 MG PO CHEW
81.0000 mg | CHEWABLE_TABLET | Freq: Two times a day (BID) | ORAL | Status: DC
  Administered 2023-02-22 – 2023-02-23 (×2): 81 mg via ORAL
  Filled 2023-02-22 (×2): qty 1

## 2023-02-22 MED ORDER — FENTANYL CITRATE PF 50 MCG/ML IJ SOSY
50.0000 ug | PREFILLED_SYRINGE | INTRAMUSCULAR | Status: DC
  Administered 2023-02-22: 50 ug via INTRAVENOUS
  Filled 2023-02-22: qty 2

## 2023-02-22 MED ORDER — PROPOFOL 10 MG/ML IV BOLUS
INTRAVENOUS | Status: DC | PRN
  Administered 2023-02-22: 50 mg via INTRAVENOUS

## 2023-02-22 MED ORDER — ALPRAZOLAM 0.25 MG PO TABS: 0.2500 mg | ORAL_TABLET | Freq: Every evening | ORAL | Status: DC | PRN

## 2023-02-22 MED ORDER — ORAL CARE MOUTH RINSE: 15.0000 mL | Freq: Once | OROMUCOSAL | Status: AC

## 2023-02-22 MED ORDER — POVIDONE-IODINE 10 % EX SWAB: 2.0000 | Freq: Once | CUTANEOUS | Status: DC

## 2023-02-22 MED ORDER — SODIUM CHLORIDE 0.9 % IR SOLN
Status: DC | PRN
  Administered 2023-02-22: 3000 mL

## 2023-02-22 MED ORDER — BUPIVACAINE LIPOSOME 1.3 % IJ SUSP: 20.0000 mL | Freq: Once | INTRAMUSCULAR | Status: DC

## 2023-02-22 MED ORDER — SODIUM CHLORIDE (PF) 0.9 % IJ SOLN
INTRAMUSCULAR | Status: AC
  Filled 2023-02-22: qty 50

## 2023-02-22 MED ORDER — FAMOTIDINE 20 MG PO TABS: 20.0000 mg | ORAL_TABLET | Freq: Every day | ORAL | Status: DC | PRN

## 2023-02-22 MED ORDER — ONDANSETRON HCL 4 MG/2ML IJ SOLN
INTRAMUSCULAR | Status: AC
  Filled 2023-02-22: qty 2

## 2023-02-22 MED ORDER — CHLORHEXIDINE GLUCONATE 0.12 % MT SOLN
15.0000 mL | Freq: Once | OROMUCOSAL | Status: AC
  Administered 2023-02-22: 15 mL via OROMUCOSAL

## 2023-02-22 MED ORDER — DEXAMETHASONE SODIUM PHOSPHATE 10 MG/ML IJ SOLN
INTRAMUSCULAR | Status: AC
  Filled 2023-02-22: qty 1

## 2023-02-22 MED ORDER — BUPIVACAINE HCL 0.25 % IJ SOLN
INTRAMUSCULAR | Status: AC
  Filled 2023-02-22: qty 1

## 2023-02-22 MED ORDER — LIDOCAINE HCL (PF) 2 % IJ SOLN
INTRAMUSCULAR | Status: DC | PRN
  Administered 2023-02-22: 60 mg via INTRADERMAL

## 2023-02-22 MED ORDER — ONDANSETRON HCL 4 MG PO TABS: 4.0000 mg | ORAL_TABLET | Freq: Three times a day (TID) | ORAL | 0 refills | Status: AC | PRN

## 2023-02-22 MED ORDER — GLYCOPYRROLATE 0.2 MG/ML IJ SOLN
INTRAMUSCULAR | Status: AC
  Filled 2023-02-22: qty 1

## 2023-02-22 MED ORDER — PHENOL 1.4 % MT LIQD: 1.0000 | OROMUCOSAL | Status: DC | PRN

## 2023-02-22 MED ORDER — ONDANSETRON HCL 4 MG/2ML IJ SOLN: 4.0000 mg | Freq: Four times a day (QID) | INTRAMUSCULAR | Status: DC | PRN

## 2023-02-22 MED ORDER — PROPOFOL 1000 MG/100ML IV EMUL
INTRAVENOUS | Status: AC
  Filled 2023-02-22: qty 100

## 2023-02-22 MED ORDER — DOCUSATE SODIUM 100 MG PO CAPS
100.0000 mg | ORAL_CAPSULE | Freq: Two times a day (BID) | ORAL | Status: DC
  Administered 2023-02-23: 100 mg via ORAL
  Filled 2023-02-22 (×2): qty 1

## 2023-02-22 MED ORDER — ACETAMINOPHEN 500 MG PO TABS
ORAL_TABLET | ORAL | Status: AC
  Filled 2023-02-22: qty 2

## 2023-02-22 MED ORDER — CEFAZOLIN SODIUM-DEXTROSE 2-4 GM/100ML-% IV SOLN
2.0000 g | INTRAVENOUS | Status: AC
  Administered 2023-02-22: 2 g via INTRAVENOUS
  Filled 2023-02-22: qty 100

## 2023-02-22 MED ORDER — CELECOXIB 100 MG PO CAPS: 100.0000 mg | ORAL_CAPSULE | Freq: Two times a day (BID) | ORAL | 0 refills | Status: AC

## 2023-02-22 MED ORDER — CEFAZOLIN SODIUM-DEXTROSE 2-4 GM/100ML-% IV SOLN
INTRAVENOUS | Status: AC
  Filled 2023-02-22: qty 100

## 2023-02-22 MED ORDER — DIPHENHYDRAMINE HCL 12.5 MG/5ML PO ELIX: 12.5000 mg | ORAL_SOLUTION | ORAL | Status: DC | PRN

## 2023-02-22 MED ORDER — GLYCOPYRROLATE 0.2 MG/ML IJ SOLN
INTRAMUSCULAR | Status: DC | PRN
  Administered 2023-02-22: .2 mg via INTRAVENOUS

## 2023-02-22 SURGICAL SUPPLY — 64 items
ADH SKN CLS APL DERMABOND .7 (GAUZE/BANDAGES/DRESSINGS) ×2
APL PRP STRL LF DISP 70% ISPRP (MISCELLANEOUS) ×2
BAG COUNTER SPONGE SURGICOUNT (BAG) IMPLANT
BAG SPNG CNTER NS LX DISP (BAG) ×1
BLADE SAG 18X100X1.27 (BLADE) ×1 IMPLANT
BLADE SAW SAG 35X64 .89 (BLADE) ×1 IMPLANT
BNDG CMPR 5X3 CHSV STRCH STRL (GAUZE/BANDAGES/DRESSINGS) ×1
BNDG CMPR MED 10X6 ELC LF (GAUZE/BANDAGES/DRESSINGS) ×1
BNDG COHESIVE 3X5 TAN ST LF (GAUZE/BANDAGES/DRESSINGS) ×1 IMPLANT
BNDG ELASTIC 6X10 VLCR STRL LF (GAUZE/BANDAGES/DRESSINGS) ×1 IMPLANT
BOWL SMART MIX CTS (DISPOSABLE) ×1 IMPLANT
BSPLAT TIB 5D E CMNT STM LT (Knees) ×1 IMPLANT
CEMENT BONE R 1X40 (Cement) IMPLANT
CEMENT BONE REFOBACIN R1X40 US (Cement) IMPLANT
CHLORAPREP W/TINT 26 (MISCELLANEOUS) ×2 IMPLANT
COMPONENT KNEE FEM SZ9 LT (Knees) IMPLANT
COVER SURGICAL LIGHT HANDLE (MISCELLANEOUS) ×1 IMPLANT
CUFF TOURN SGL QUICK 34 (TOURNIQUET CUFF) ×1
CUFF TRNQT CYL 34X4.125X (TOURNIQUET CUFF) ×1 IMPLANT
DERMABOND ADVANCED .7 DNX12 (GAUZE/BANDAGES/DRESSINGS) ×1 IMPLANT
DRAPE INCISE IOBAN 85X60 (DRAPES) ×1 IMPLANT
DRAPE SHEET LG 3/4 BI-LAMINATE (DRAPES) ×1 IMPLANT
DRAPE U-SHAPE 47X51 STRL (DRAPES) ×1 IMPLANT
DRESSING AQUACEL AG SP 3.5X10 (GAUZE/BANDAGES/DRESSINGS) ×1 IMPLANT
DRSG AQUACEL AG ADV 3.5X10 (GAUZE/BANDAGES/DRESSINGS) IMPLANT
DRSG AQUACEL AG SP 3.5X10 (GAUZE/BANDAGES/DRESSINGS) ×1
ELECT REM PT RETURN 15FT ADLT (MISCELLANEOUS) ×1 IMPLANT
GAUZE SPONGE 4X4 12PLY STRL (GAUZE/BANDAGES/DRESSINGS) ×1 IMPLANT
GLOVE BIO SURGEON STRL SZ 6.5 (GLOVE) ×2 IMPLANT
GLOVE BIOGEL PI IND STRL 6.5 (GLOVE) ×1 IMPLANT
GLOVE BIOGEL PI IND STRL 8 (GLOVE) ×1 IMPLANT
GLOVE SURG ORTHO 8.0 STRL STRW (GLOVE) ×2 IMPLANT
GOWN STRL REUS W/ TWL XL LVL3 (GOWN DISPOSABLE) ×2 IMPLANT
GOWN STRL REUS W/TWL XL LVL3 (GOWN DISPOSABLE) ×2
HANDPIECE INTERPULSE COAX TIP (DISPOSABLE) ×1
HOLDER FOLEY CATH W/STRAP (MISCELLANEOUS) ×1 IMPLANT
HOOD PEEL AWAY T7 (MISCELLANEOUS) ×3 IMPLANT
KIT TURNOVER KIT A (KITS) IMPLANT
MANIFOLD NEPTUNE II (INSTRUMENTS) ×1 IMPLANT
MARKER SKIN DUAL TIP RULER LAB (MISCELLANEOUS) ×1 IMPLANT
NS IRRIG 1000ML POUR BTL (IV SOLUTION) ×1 IMPLANT
PACK TOTAL KNEE CUSTOM (KITS) ×1 IMPLANT
PIN DRILL HDLS TROCAR 75 4PK (PIN) IMPLANT
SCREW HEADED 33MM KNEE (MISCELLANEOUS) IMPLANT
SET HNDPC FAN SPRY TIP SCT (DISPOSABLE) ×1 IMPLANT
SOLUTION IRRIG SURGIPHOR (IV SOLUTION) IMPLANT
SPIKE FLUID TRANSFER (MISCELLANEOUS) ×1 IMPLANT
STEM POLY PAT PLY 32M KNEE (Knees) IMPLANT
STEM TIBIA 5 DEG SZ E L KNEE (Knees) IMPLANT
STEM TIBIAL 10 8-11 EF POLY LT (Joint) IMPLANT
STRIP CLOSURE SKIN 1/2X4 (GAUZE/BANDAGES/DRESSINGS) ×1 IMPLANT
SUT MNCRL AB 3-0 PS2 18 (SUTURE) ×1 IMPLANT
SUT STRATAFIX 0 PDS 27 VIOLET (SUTURE) ×1
SUT STRATAFIX PDO 1 14 VIOLET (SUTURE) ×1
SUT STRATFX PDO 1 14 VIOLET (SUTURE) ×1
SUT VIC AB 2-0 CT2 27 (SUTURE) ×2 IMPLANT
SUTURE STRATFX 0 PDS 27 VIOLET (SUTURE) ×1 IMPLANT
SUTURE STRATFX PDO 1 14 VIOLET (SUTURE) ×1 IMPLANT
SYR 50ML LL SCALE MARK (SYRINGE) ×1 IMPLANT
TIBIA STEM 5 DEG SZ E L KNEE (Knees) ×1 IMPLANT
TRAY FOLEY MTR SLVR 14FR STAT (SET/KITS/TRAYS/PACK) IMPLANT
TUBE SUCTION HIGH CAP CLEAR NV (SUCTIONS) ×1 IMPLANT
UNDERPAD 30X36 HEAVY ABSORB (UNDERPADS AND DIAPERS) ×1 IMPLANT
WRAP KNEE MAXI GEL POST OP (GAUZE/BANDAGES/DRESSINGS) IMPLANT

## 2023-02-22 NOTE — Transfer of Care (Signed)
Immediate Anesthesia Transfer of Care Note  Patient: Mariah Holt  Procedure(s) Performed: TOTAL KNEE ARTHROPLASTY (Left: Knee)  Patient Location: PACU  Anesthesia Type:Spinal  Level of Consciousness: awake, alert , oriented, and patient cooperative  Airway & Oxygen Therapy: Patient Spontanous Breathing and Patient connected to face mask oxygen  Post-op Assessment: Report given to RN and Post -op Vital signs reviewed and stable  Post vital signs: Reviewed and stable  Last Vitals:  Vitals Value Taken Time  BP 126/86 02/22/23 1410  Temp    Pulse 66 02/22/23 1412  Resp 10 02/22/23 1412  SpO2 97 % 02/22/23 1412  Vitals shown include unvalidated device data.  Last Pain:  Vitals:   02/22/23 1101  TempSrc:   PainSc: 0-No pain         Complications: No notable events documented.

## 2023-02-22 NOTE — Interval H&P Note (Signed)

## 2023-02-22 NOTE — Anesthesia Procedure Notes (Signed)
Spinal  Patient location during procedure: OR Start time: 02/22/2023 11:37 AM End time: 02/22/2023 11:40 AM Reason for block: procedure for pain Staffing Resident/CRNA: Johnette Abraham, CRNA Performed by: Johnette Abraham, CRNA Authorized by: Lowella Curb, MD   Spinal Block Patient position: sitting Prep: DuraPrep Patient monitoring: heart rate, cardiac monitor, continuous pulse ox and blood pressure Approach: midline Location: L4-5 Injection technique: single-shot Needle Needle type: Pencan  Needle gauge: 24 G Additional Notes 1.6 ml Bupivacaine .75%

## 2023-02-22 NOTE — Op Note (Signed)
DATE OF SURGERY:  02/22/2023 TIME: 2:45 PM  PATIENT NAME:  Mariah Holt   AGE: 72 y.o.   PRE-OPERATIVE DIAGNOSIS: End-stage left knee osteoarthritis  POST-OPERATIVE DIAGNOSIS:  Same  PROCEDURE: Left total Knee Arthroplasty  SURGEON:  Jillyan Plitt A Linwood Gullikson, MD   ASSISTANT: Kathie Dike, PA-C, present and scrubbed throughout the case, critical for assistance with exposure, retraction, instrumentation, and closure.   OPERATIVE IMPLANTS:  Cemented Zimmer persona size 9 narrow CR femur tibial baseplate, 10 mm MC, 32 mm patella Implant Name Type Inv. Item Serial No. Manufacturer Lot No. LRB No. Used Action  CEMENT BONE R 1X40 - [id number] Cement CEMENT BONE R 1X40  [employer](ORTH,TRAU,BIO,SG) [id number] Left 1 Implanted  CEMENT BONE R 1X40 - [id number] Cement CEMENT BONE R 1X40  [employer](ORTH,TRAU,BIO,SG) [id number] Left 1 Implanted  STEM POLY PAT PLY 37M KNEE - [id number] Knees STEM POLY PAT PLY 37M KNEE  [employer](ORTH,TRAU,BIO,SG) [id number] Left 1 Implanted  TIBIA STEM 5 DEG SZ E L KNEE - [id number] Knees TIBIA STEM 5 DEG SZ E L KNEE  [employer]ON(ORTH,TRAU,BIO,SG) [id number] Left 1 Implanted  COMPONENT KNEE FEM SZ9 LT - [id number] Knees COMPONENT KNEE FEM SZ9 LT  [employer]MER [employer]ON(ORTH,TRAU,BIO,SG) [id number] Left 1 Implanted  STEM TIBIAL 10 8-11 EF POLY LT - [id number] Joint STEM TIBIAL 10 8-11 EF POLY LT  [employer]ON(ORTH,TRAU,BIO,SG) [id number] Left 1 Implanted      PREOPERATIVE INDICATIONS:  Mariah Holt is a 72 y.o. year old female with end stage bone on bone degenerative arthritis of the knee who failed conservative treatment, including injections, antiinflammatories, activity modification, and assistive devices, and had significant impairment of their activities of daily living, and elected for Total Knee Arthroplasty.   The risks, benefits, and alternatives were discussed at length including but not limited to the risks of infection, bleeding, nerve injury, stiffness,  blood clots, the need for revision surgery, cardiopulmonary complications, among others, and they were willing to proceed.    ESTIMATED BLOOD LOSS: 25cc  OPERATIVE DESCRIPTION:   Once adequate anesthesia was induced, preoperative antibiotics, 2 gm of ancef,1 gm of Tranexamic Acid, and 8 mg of Decadron administered, the patient was positioned supine with a left thigh tourniquet placed.  The left lower extremity was prepped and draped in sterile fashion.  A time-  out was performed identifying the patient, planned procedure, and the appropriate extremity.     The leg was  exsanguinated, tourniquet elevated to 250 mmHg.  A midline incision was  made followed by median parapatellar arthrotomy. Anterior horn of the medial meniscus was released and resected. A medial release was performed, the infrapatellar fat pad was resected with care taken to protect the patellar tendon. The suprapatellar fat was removed to exposed the distal anterior femur. The anterior horn of the lateral meniscus and ACL were released.    Following initial  exposure, I first started with the femur  The femoral  canal was opened with a drill, canal was suctioned to try to prevent fat emboli.  An  intramedullary rod was passed set at 5 degrees valgus, 10 mm. The distal femur was resected.  Following this resection, the tibia was  subluxated anteriorly.  Using the extramedullary guide, 10 mm of bone was resected off   the proximal latearl tibia.  We confirmed the gap would be  stable medially and laterally with a size 10mm spacer block as well as confirmed that the tibial cut was perpendicular in the coronal plane, checking with an alignment rod.  Once this was done, the posterior femoral referencing femoral sizer was placed under to the posterior condyles with 3 degrees of external rotational which was parallel to the transepicondylar axis and perpendicular to [employer]. The femur was sized to be a size 9 in the anterior-   posterior dimension. The  anterior, posterior, and  chamfer cuts were made without difficulty nor   notching making certain that I was along the anterior cortex to help  with flexion gap stability. Next a laminar spreader was placed with the knee in flexion and the medial lateral menisci were resected.  5 cc of the Exparel mixture was injected in the medial side of the back of the knee and 3 cc in the lateral side.  1/2 inch curved osteotome was used to resect posterior osteophyte that was then removed with a pituitary rongeur.       At this point, the tibia was sized to be a size E.  The size E tray was  then pinned in position. Trial reduction was now carried with a 9 femur, E tibia, a 10 mm MC insert.  The knee had full extension and was stable to varus valgus stress in extension.  The knee was slightly tight in flexion and the PCL was partially released.   Attention was next directed to the patella.  Precut  measurement was noted to be 24 mm.  I resected down to 14 mm and used a  32mm patellar button to restore patellar height as well as cover the cut surface.     The patella lug holes were drilled and a 32 mm patella poly trial was placed.    The knee was brought to full extension with good flexion stability with the patella tracking through the trochlea without application of pressure.     Next the femoral component was again assessed and determined to be seated and appropriately lateralized.  The femoral lug holes were drilled.  The femoral component was then removed. Tibial component was again assessed and felt to be seated and appropriately rotated with the medial third of the tubercle. The tibia was then drilled, and keel punched.     Final components were  opened and cement was mixed.      Final implants were then  cemented onto cleaned and dried cut surfaces of bone with the knee brought to extension with a 10 mm MC poly.  The knee was irrigated with sterile Betadine diluted in saline as  well as pulse lavage normal saline. The synovial lining was  then injected a dilute Exparel with 30cc of 0.25% marcaine with epinephrine.         Once the cement had fully cured, excess cement was removed throughout the knee.  I confirmed that I was satisfied with the range of motion and stability, and the final 10 mm MC poly insert was chosen.  It was placed into the knee.         The tourniquet had been let down.  No significant hemostasis was required.  The medial parapatellar arthrotomy was then reapproximated using #1 Stratafix sutures with the knee  in flexion.  The remaining wound was closed with 0 stratafix, 2-0 Vicryl, and running 3-0 Monocryl. The knee was cleaned, dried, dressed sterilely using Dermabond and   Aquacel dressing.  The patient was then brought to recovery room in stable condition, tolerating the procedure  well. There were no complications.   Post op recs: WB: WBAT Abx: ancef Imaging: PACU  xrays DVT prophylaxis: Aspirin 81mg  BID x4 weeks Follow up: 2 weeks after surgery for a wound check with Dr. Zachery Dakins at [facility].  Address: [address] [place], [place],  [address]  Office Phone: [phone]  Charlies Constable, MD Orthopaedic Surgery

## 2023-02-22 NOTE — Anesthesia Procedure Notes (Signed)
Anesthesia Regional Block: Adductor canal block   Pre-Anesthetic Checklist: , timeout performed,  Correct Patient, Correct Site, Correct Laterality,  Correct Procedure, Correct Position, site marked,  Risks and benefits discussed,  Surgical consent,  Pre-op evaluation,  At surgeon's request and post-op pain management  Laterality: Left  Prep: chloraprep       Needles:  Injection technique: Single-shot  Needle Type: Stimiplex     Needle Length: 9cm  Needle Gauge: 21     Additional Needles:   Procedures:,,,, ultrasound used (permanent image in chart),,    Narrative:  Start time: 02/22/2023 10:27 AM End time: 02/22/2023 10:32 AM Injection made incrementally with aspirations every 5 mL.  Performed by: Personally  Anesthesiologist: Lowella Curb, MD

## 2023-02-22 NOTE — Progress Notes (Signed)
Orthopedic Tech Progress Note Patient Details:  Mariah Holt Nov 21, 1950 [id number]  Delivered and dropped of bone foam to bedside with PACU RN. Ortho Devices Type of Ortho Device: Bone foam zero knee Ortho Device/Splint Interventions: Floria Raveling 02/22/2023, 2:45 PM

## 2023-02-22 NOTE — Evaluation (Signed)
Physical Therapy Evaluation Patient Details Name: Mariah Holt MRN: [id number] DOB: [birth year] Today's Date: 02/22/2023  History of Present Illness  72 yo female presents to therapy s/p L TKA on 02/22/2023 due to failure of conservative measures. Pt PMH includes but is not limited to: aortic aneurysm, GERD, HDL, and HTN.  Clinical Impression    Mariah Holt is a 72 y.o. female POD 0 s/p L TKA. Patient reports IND with mobility at baseline. Patient is now limited by functional impairments (see PT problem list below) and requires min guard for supine to sit with use of B UE support to maneuver LLE to EOB, mod A for sit to supine to manage LLE, min A for transfers. Patient was able to ambulate 2 feet with RW and min A and cues for safe walker management. Noted poor L quad innervation suspect to slow regression of anesthesia and pt lethargic presentation suspect to medication. L LE instability with tranfers, static standing and gait trial. Pt required cues for attention and often falling asleep with slight confusion and pt reporting feeling "groggy." Daughter present t/o eval.  Patient educated on safe sequencing for stair mobility and verbalized safe guarding position for people assisting with mobility. Patient instructed in exercises to facilitate ROM and circulation in supine. Patient will benefit from continued skilled PT interventions to address impairments and progress towards PLOF. Patient has not met mobility goals at adequate level for safe discharge home with family support and OPPT; PT recommendation for pt transition to inpt care and pt and daughter in agreement; will continue to follow pt during acute stay to progress towards Mod I goals.      Recommendations for follow up therapy are one component of a multi-disciplinary discharge planning process, led by the attending physician.  Recommendations may be updated based on patient status, additional functional criteria and insurance  authorization.  Follow Up Recommendations       Assistance Recommended at Discharge Intermittent Supervision/Assistance  Patient can return home with the following  A little help with walking and/or transfers;A little help with bathing/dressing/bathroom;Assistance with cooking/housework;Assist for transportation;Help with stairs or ramp for entrance    Equipment Recommendations Rolling walker (2 wheels) (provided and adjusted at eval)  Recommendations for Other Services       Functional Status Assessment Patient has had a recent decline in their functional status and demonstrates the ability to make significant improvements in function in a reasonable and predictable amount of time.     Precautions / Restrictions Precautions Precautions: Knee;Fall Restrictions Weight Bearing Restrictions: No      Mobility  Bed Mobility Overal bed mobility: Needs Assistance Bed Mobility: Supine to Sit, Sit to Supine     Supine to sit: Min guard Sit to supine: Mod assist (for LLE)   General bed mobility comments: pt required to use B UEs to assist LLE to EOB    Transfers Overall transfer level: Needs assistance Equipment used: Rolling walker (2 wheels) Transfers: Sit to/from Stand, Bed to chair/wheelchair/BSC Sit to Stand: Min assist Stand pivot transfers: Min assist         General transfer comment: pt required min A for STS from stretcher, cues for attention and participation with weight shifting tasks, noted LLE instabiltiy with weight acceptance and pt able to compensate with B UE support at RW, PT had reservations concerning pt lethargic and requried cues for attention with pt reporting feeling groggy, pt able to progress with brief gait trial and PT discontinued requiring to pull stretcher to  pt, pt required min A to maintain sitting balance on the stretcher, nursing staff assisted with SPT stretcher to hospital bed with min A, PT managing RW, providing cues and support for balance  with LOB x 1 mod A to recover due to LLE instabiltiy    Ambulation/Gait Ambulation/Gait assistance: Min assist Gait Distance (Feet): 2 Feet Assistive device: Rolling walker (2 wheels) Gait Pattern/deviations: Step-to pattern, Antalgic       General Gait Details: L LE instabiltiy noted and pt lethargy impacting safety and progression with gait tasks  Stairs            Wheelchair Mobility    Modified Rankin (Stroke Patients Only)       Balance Overall balance assessment: Needs assistance Sitting-balance support: Feet supported, Bilateral upper extremity supported Sitting balance-Leahy Scale: Poor     Standing balance support: Bilateral upper extremity supported, During functional activity, Reliant on assistive device for balance Standing balance-Leahy Scale: Poor                               Pertinent Vitals/Pain Pain Assessment Pain Assessment: 0-10 Pain Score: 4  Pain Location: L leg Pain Descriptors / Indicators: Aching, Constant, Discomfort, Operative site guarding, Throbbing Pain Intervention(s): Limited activity within patient's tolerance, Monitored during session, Premedicated before session, Repositioned, Ice applied    Home Living Family/patient expects to be discharged to:: Private residence Living Arrangements: Spouse/significant other Available Help at Discharge: Family Type of Home: House Home Access: Stairs to enter Entrance Stairs-Rails: Doctor, general practice of Steps: 5   Home Layout: Multi-level;Able to live on main level with bedroom/bathroom Home Equipment: None      Prior Function Prior Level of Function : Independent/Modified Independent;Driving             Mobility Comments: IND with all ADLs, self care tasks, IADLs, driving       Hand Dominance        Extremity/Trunk Assessment        Lower Extremity Assessment Lower Extremity Assessment: LLE deficits/detail LLE Deficits / Details: ankle  DF/PF 5/5; SLR  < 10 degree lag, pt unable to perform L SAQ compensation with hip flexion LLE Sensation: WNL    Cervical / Trunk Assessment Cervical / Trunk Assessment:  (wfl)  Communication   Communication: No difficulties  Cognition Arousal/Alertness: Lethargic, Suspect due to medications Behavior During Therapy: Flat affect (cues for maintaining EO) Overall Cognitive Status: Impaired/Different from baseline Area of Impairment: Safety/judgement, Awareness, Attention                         Safety/Judgement: Decreased awareness of safety, Decreased awareness of deficits              General Comments      Exercises Total Joint Exercises Ankle Circles/Pumps: AROM, Both, 20 reps Quad Sets: AAROM, Left, 5 reps (pt demonstrated difficulty engaging quad and multimodal cues) Short Arc Quad: PROM, Left, 5 reps (pt unable to perform L knee extension against gravity, indicative of poor quad innervation) Heel Slides: AROM, Left, 5 reps (pt utlizing L hip flexion as compensatory mechanism) Straight Leg Raises: AROM, Left, 5 reps (compensation with L hip flexors)   Assessment/Plan    PT Assessment Patient needs continued PT services  PT Problem List Decreased strength;Decreased range of motion;Decreased activity tolerance;Decreased mobility;Decreased balance;Decreased coordination;Pain       PT Treatment Interventions DME instruction;Gait training;Stair training;Functional mobility  training;Therapeutic activities;Therapeutic exercise;Balance training;Neuromuscular re-education;Patient/family education;Modalities    PT Goals (Current goals can be found in the Care Plan section)  Acute Rehab PT Goals Patient Stated Goal: to be able to get back to my regular activities and exercise classes to be able to walk t/o [employer] without fatigue or pain PT Goal Formulation: With patient Time For Goal Achievement: 03/08/23 Potential to Achieve Goals: Good    Frequency 7X/week      Co-evaluation               AM-PAC PT "6 Clicks" Mobility  Outcome Measure Help needed turning from your back to your side while in a flat bed without using bedrails?: A Little Help needed moving from lying on your back to sitting on the side of a flat bed without using bedrails?: A Little Help needed moving to and from a bed to a chair (including a wheelchair)?: A Lot Help needed standing up from a chair using your arms (e.g., wheelchair or bedside chair)?: A Lot Help needed to walk in hospital room?: A Lot Help needed climbing 3-5 steps with a railing? : Total 6 Click Score: 13    End of Session Equipment Utilized During Treatment: Gait belt Activity Tolerance: Patient limited by lethargy;Patient limited by fatigue Patient left: in bed;with call bell/phone within reach;with nursing/sitter in room;with family/visitor present Nurse Communication: Mobility status PT Visit Diagnosis: Unsteadiness on feet (R26.81);Other abnormalities of gait and mobility (R26.89);Muscle weakness (generalized) (M62.81);Pain Pain - Right/Left: Left Pain - part of body: Knee;Leg    Time: 1610-9604 PT Time Calculation (min) (ACUTE ONLY): 46 min   Charges:   PT Evaluation $PT Eval Low Complexity: 1 Low PT Treatments $Therapeutic Exercise: 8-22 mins $Therapeutic Activity: 8-22 mins        Johnny Bridge, PT Acute Rehab   Jacqualyn Posey 02/22/2023, 7:11 PM

## 2023-02-22 NOTE — Discharge Instructions (Signed)
INSTRUCTIONS AFTER JOINT REPLACEMENT  ° °Remove items at home which could result in a fall. This includes throw rugs or furniture in walking pathways °ICE to the affected joint every three hours while awake for 30 minutes at a time, for at least the first 3-5 days, and then as needed for pain and swelling.  Continue to use ice for pain and swelling. You may notice swelling that will progress down to the foot and ankle.  This is normal after surgery.  Elevate your leg when you are not up walking on it.   °Continue to use the breathing machine you got in the hospital (incentive spirometer) which will help keep your temperature down.  It is common for your temperature to cycle up and down following surgery, especially at night when you are not up moving around and exerting yourself.  The breathing machine keeps your lungs expanded and your temperature down. ° ° °DIET:  As you were doing prior to hospitalization, we recommend a well-balanced diet. ° °DRESSING / WOUND CARE / SHOWERING ° °Keep the surgical dressing until follow up.  The dressing is water proof, so you can shower without any extra covering.  IF THE DRESSING FALLS OFF or the wound gets wet inside, change the dressing with sterile gauze.  Please use good hand washing techniques before changing the dressing.  Do not use any lotions or creams on the incision until instructed by your surgeon.   ° °ACTIVITY ° °Increase activity slowly as tolerated, but follow the weight bearing instructions below.   °No driving for 6 weeks or until further direction given by your physician.  You cannot drive while taking narcotics.  °No lifting or carrying greater than 10 lbs. until further directed by your surgeon. °Avoid periods of inactivity such as sitting longer than an hour when not asleep. This helps prevent blood clots.  °You may return to work once you are authorized by your doctor.  ° ° ° °WEIGHT BEARING  ° °Weight bearing as tolerated with assist device (walker, cane,  etc) as directed, use it as long as suggested by your surgeon or therapist, typically at least 4-6 weeks. ° ° °EXERCISES ° °Results after joint replacement surgery are often greatly improved when you follow the exercise, range of motion and muscle strengthening exercises prescribed by your doctor. Safety measures are also important to protect the joint from further injury. Any time any of these exercises cause you to have increased pain or swelling, decrease what you are doing until you are comfortable again and then slowly increase them. If you have problems or questions, call your caregiver or physical therapist for advice.  ° °Rehabilitation is important following a joint replacement. After just a few days of immobilization, the muscles of the leg can become weakened and shrink (atrophy).  These exercises are designed to build up the tone and strength of the thigh and leg muscles and to improve motion. Often times heat used for twenty to thirty minutes before working out will loosen up your tissues and help with improving the range of motion but do not use heat for the first two weeks following surgery (sometimes heat can increase post-operative swelling).  ° °These exercises can be done on a training (exercise) mat, on the floor, on a table or on a bed. Use whatever works the best and is most comfortable for you.    Use music or television while you are exercising so that the exercises are a pleasant break in your   day. This will make your life better with the exercises acting as a break in your routine that you can look forward to.   Perform all exercises about fifteen times, three times per day or as directed.  You should exercise both the operative leg and the other leg as well. ° °Exercises include: °  °Quad Sets - Tighten up the muscle on the front of the thigh (Quad) and hold for 5-10 seconds.   °Straight Leg Raises - With your knee straight (if you were given a brace, keep it on), lift the leg to 60  degrees, hold for 3 seconds, and slowly lower the leg.  Perform this exercise against resistance later as your leg gets stronger.  °Leg Slides: Lying on your back, slowly slide your foot toward your buttocks, bending your knee up off the floor (only go as far as is comfortable). Then slowly slide your foot back down until your leg is flat on the floor again.  °Angel Wings: Lying on your back spread your legs to the side as far apart as you can without causing discomfort.  °Hamstring Strength:  Lying on your back, push your heel against the floor with your leg straight by tightening up the muscles of your buttocks.  Repeat, but this time bend your knee to a comfortable angle, and push your heel against the floor.  You may put a pillow under the heel to make it more comfortable if necessary.  ° °A rehabilitation program following joint replacement surgery can speed recovery and prevent re-injury in the future due to weakened muscles. Contact your doctor or a physical therapist for more information on knee rehabilitation.  ° ° °CONSTIPATION ° °Constipation is defined medically as fewer than three stools per week and severe constipation as less than one stool per week.  Even if you have a regular bowel pattern at home, your normal regimen is likely to be disrupted due to multiple reasons following surgery.  Combination of anesthesia, postoperative narcotics, change in appetite and fluid intake all can affect your bowels.  ° °YOU MUST use at least one of the following options; they are listed in order of increasing strength to get the job done.  They are all available over the counter, and you may need to use some, POSSIBLY even all of these options:   ° °Drink plenty of fluids (prune juice may be helpful) and high fiber foods °Colace 100 mg by mouth twice a day  °Senokot for constipation as directed and as needed Dulcolax (bisacodyl), take with full glass of water  °Miralax (polyethylene glycol) once or twice a day as  needed. ° °If you have tried all these things and are unable to have a bowel movement in the first 3-4 days after surgery call either your surgeon or your primary doctor.   ° °If you experience loose stools or diarrhea, hold the medications until you stool forms back up.  If your symptoms do not get better within 1 week or if they get worse, check with your doctor.  If you experience "the worst abdominal pain ever" or develop nausea or vomiting, please contact the office immediately for further recommendations for treatment. ° ° °ITCHING:  If you experience itching with your medications, try taking only a single pain pill, or even half a pain pill at a time.  You can also use Benadryl over the counter for itching or also to help with sleep.  ° °TED HOSE STOCKINGS:  Use stockings on both   legs until for at least 2 weeks or as directed by physician office. They may be removed at night for sleeping. ° °MEDICATIONS:  See your medication summary on the “After Visit Summary” that nursing will review with you.  You may have some home medications which will be placed on hold until you complete the course of blood thinner medication.  It is important for you to complete the blood thinner medication as prescribed. ° ° °Blood clot prevention (DVT Prophylaxis): After surgery you are at an increased risk for a blood clot. you were prescribed a blood thinner, Aspirin 81mg, to be taken twice daily for a total of 4 weeks from surgery to help reduce your risk of getting a blood clot. This will help prevent a blood clot. Signs of a pulmonary embolus (blood clot in the lungs) include sudden short of breath, feeling lightheaded or dizzy, chest pain with a deep breath, rapid pulse rapid breathing. Signs of a blood clot in your arms or legs include new unexplained swelling and cramping, warm, red or darkened skin around the painful area. Please call the office or 911 right away if these signs or symptoms develop. ° °PRECAUTIONS:  If you  experience chest pain or shortness of breath - call 911 immediately for transfer to the hospital emergency department.  ° °If you develop a fever greater that 101 F, purulent drainage from wound, increased redness or drainage from wound, foul odor from the wound/dressing, or calf pain - CONTACT YOUR SURGEON.   °                                                °FOLLOW-UP APPOINTMENTS:  If you do not already have a post-op appointment, please call the office for an appointment to be seen by your surgeon.  Guidelines for how soon to be seen are listed in your “After Visit Summary”, but are typically between 2-3 weeks after surgery. ° °OTHER INSTRUCTIONS:  ° °Knee Replacement:  Do not place pillow under knee, focus on keeping the knee straight while resting. CPM instructions: 0-90 degrees, 2 hours in the morning, 2 hours in the afternoon, and 2 hours in the evening. Place foam block, curve side up under heel at all times except when in CPM or when walking.  DO NOT modify, tear, cut, or change the foam block in any way. ° °POST-OPERATIVE OPIOID TAPER INSTRUCTIONS: °It is important to wean off of your opioid medication as soon as possible. If you do not need pain medication after your surgery it is ok to stop day one. °Opioids include: °Codeine, Hydrocodone(Norco, Vicodin), Oxycodone(Percocet, oxycontin) and hydromorphone amongst others.  °Long term and even short term use of opiods can cause: °Increased pain response °Dependence °Constipation °Depression °Respiratory depression °And more.  °Withdrawal symptoms can include °Flu like symptoms °Nausea, vomiting °And more °Techniques to manage these symptoms °Hydrate well °Eat regular healthy meals °Stay active °Use relaxation techniques(deep breathing, meditating, yoga) °Do Not substitute Alcohol to help with tapering °If you have been on opioids for less than two weeks and do not have pain than it is ok to stop all together.  °Plan to wean off of opioids °This plan should  start within one week post op of your joint replacement. °Maintain the same interval or time between taking each dose and first decrease the dose.  °Cut the   total daily intake of opioids by one tablet each day °Next start to increase the time between doses. °The last dose that should be eliminated is the evening dose.  ° °MAKE SURE YOU:  °Understand these instructions.  °Get help right away if you are not doing well or get worse.  ° ° °Thank you for letting us be a part of your medical care team.  It is a privilege we respect greatly.  We hope these instructions will help you stay on track for a fast and full recovery!  ° ° ° °  °  °

## 2023-02-22 NOTE — Anesthesia Postprocedure Evaluation (Signed)
Anesthesia Post Note  Patient: Mariah Holt  Procedure(s) Performed: TOTAL KNEE ARTHROPLASTY (Left: Knee)     Patient location during evaluation: PACU Anesthesia Type: Spinal Level of consciousness: awake and alert Pain management: pain level controlled Vital Signs Assessment: post-procedure vital signs reviewed and stable Respiratory status: spontaneous breathing, nonlabored ventilation and respiratory function stable Cardiovascular status: blood pressure returned to baseline and stable Postop Assessment: no apparent nausea or vomiting Anesthetic complications: no   No notable events documented.  Last Vitals:  Vitals:   02/22/23 1515 02/22/23 1530  BP: (!) 152/91 (!) 153/86  Pulse: (!) 56 60  Resp: 13 10  Temp:    SpO2: 98% 96%    Last Pain:  Vitals:   02/22/23 1530  TempSrc:   PainSc: 2                  Lowella Curb

## 2023-02-23 ENCOUNTER — Encounter (HOSPITAL_COMMUNITY): Payer: Self-pay | Admitting: Orthopedic Surgery

## 2023-02-23 ENCOUNTER — Other Ambulatory Visit: Payer: Self-pay

## 2023-02-23 DIAGNOSIS — M1712 Unilateral primary osteoarthritis, left knee: Secondary | ICD-10-CM | POA: Diagnosis not present

## 2023-02-23 LAB — BASIC METABOLIC PANEL
Anion gap: 9 (ref 5–15)
BUN: 16 mg/dL (ref 8–23)
CO2: 25 mmol/L (ref 22–32)
Calcium: 8.5 mg/dL — ABNORMAL LOW (ref 8.9–10.3)
Chloride: 104 mmol/L (ref 98–111)
Creatinine, Ser: 0.97 mg/dL (ref 0.44–1.00)
GFR, Estimated: >60 mL/min (ref >60)
Glucose, Bld: 133 mg/dL — ABNORMAL HIGH (ref 70–99)
Potassium: 3.3 mmol/L — ABNORMAL LOW (ref 3.5–5.1)
Sodium: 138 mmol/L (ref 135–145)

## 2023-02-23 LAB — CBC
HCT: 34.8 % — ABNORMAL LOW (ref 36.0–46.0)
Hemoglobin: 11.3 g/dL — ABNORMAL LOW (ref 12.0–15.0)
MCH: 28.3 pg (ref 26.0–34.0)
MCHC: 32.5 g/dL (ref 30.0–36.0)
MCV: 87 fL (ref 80.0–100.0)
Platelets: 261 10*3/uL (ref 150–400)
RBC: 4 MIL/uL (ref 3.87–5.11)
RDW: 14.2 % (ref 11.5–15.5)
WBC: 10.5 10*3/uL (ref 4.0–10.5)
nRBC: 0 % (ref 0.0–0.2)

## 2023-02-23 MED ORDER — HYDROCODONE-ACETAMINOPHEN 5-325 MG PO TABS: 1.0000 | ORAL_TABLET | ORAL | 0 refills | Status: DC | PRN

## 2023-02-23 NOTE — TOC Transition Note (Signed)
Transition of Care [facility]) - CM/SW Discharge Note  Patient Details  Name: Mariah Holt MRN: [id number] Date of Birth: [birth year]  [facility] CM/SW Contact:  Ewing Schlein, LCSW Phone Number: 02/23/2023, 12:01 PM  Clinical Narrative: Patient is expected to discharge home after working with PT. CSW met with patient to confirm discharge plan. Patient will go home with OPPT. Patient has a rolling walker, so there are no DME needs at this time. TOC signing off.  Final next level of care: OP Rehab Barriers to Discharge: No Barriers Identified  Patient Goals and CMS Choice Choice offered to / list presented to : NA  Discharge Plan and Services Additional resources added to the After Visit Summary for         DME Arranged: N/A DME Agency: NA  Social Determinants of Health (SDOH) Interventions SDOH Screenings   Food Insecurity: No Food Insecurity (02/22/2023)  Housing: Low Risk  (02/22/2023)  Transportation Needs: No Transportation Needs (02/22/2023)  Utilities: Not At Risk (02/22/2023)  Tobacco Use: Medium Risk (02/22/2023)   Readmission Risk Interventions     No data to display

## 2023-02-23 NOTE — Progress Notes (Signed)
Physical Therapy Treatment Patient Details Name: Mariah Holt MRN: [id number] DOB: Aug 11, 1951 Today's Date: 02/23/2023   History of Present Illness 72 yo female presents to therapy s/p L TKA on 02/22/2023 due to failure of conservative measures. Pt PMH includes but is not limited to: aortic aneurysm, GERD, HDL, and HTN.    PT Comments    Stair training completed. Pt has met PT goals and is ready to DC home from a PT standpoint.    Recommendations for follow up therapy are one component of a multi-disciplinary discharge planning process, led by the attending physician.  Recommendations may be updated based on patient status, additional functional criteria and insurance authorization.  Follow Up Recommendations       Assistance Recommended at Discharge Intermittent Supervision/Assistance  Patient can return home with the following A little help with walking and/or transfers;A little help with bathing/dressing/bathroom;Assistance with cooking/housework;Assist for transportation;Help with stairs or ramp for entrance   Equipment Recommendations  Rolling walker (2 wheels) (provided and adjusted at eval)    Recommendations for Other Services       Precautions / Restrictions Precautions Precautions: Knee;Fall Precaution Booklet Issued: Yes (comment) Precaution Comments: reviewed no pillow under knee Restrictions Weight Bearing Restrictions: No     Mobility  Bed Mobility Overal bed mobility: Modified Independent Bed Mobility: Supine to Sit     Supine to sit: Modified independent (Device/Increase time)     General bed mobility comments: up in recliner    Transfers Overall transfer level: Needs assistance Equipment used: Rolling walker (2 wheels) Transfers: Sit to/from Stand Sit to Stand: From elevated surface, Supervision           General transfer comment: VCs hand placement    Ambulation/Gait Ambulation/Gait assistance: Supervision Gait Distance (Feet): 120  Feet Assistive device: Rolling walker (2 wheels) Gait Pattern/deviations: Step-to pattern Gait velocity: decr     General Gait Details: VCs to roll rather than lift RW, steady, no loss of balance, no buckling   Stairs Stairs: Yes Stairs assistance: Min guard Stair Management: One rail Left, Step to pattern, Sideways Number of Stairs: 3 General stair comments: VCs sequencing, no loss of balance nor buckling   Wheelchair Mobility    Modified Rankin (Stroke Patients Only)       Balance Overall balance assessment: Needs assistance Sitting-balance support: Feet supported, Bilateral upper extremity supported Sitting balance-Leahy Scale: Poor     Standing balance support: Bilateral upper extremity supported, During functional activity, Reliant on assistive device for balance Standing balance-Leahy Scale: Poor                              Cognition Arousal/Alertness: Awake/alert Behavior During Therapy: WFL for tasks assessed/performed Overall Cognitive Status: Within Functional Limits for tasks assessed                                 General Comments: has mild stutter, pt reports this is baseline        Exercises     General Comments        Pertinent Vitals/Pain Pain Assessment Pain Score: 2  Pain Location: L leg Pain Descriptors / Indicators: Aching, Discomfort, Operative site guarding, Moaning Pain Intervention(s): Limited activity within patient's tolerance, Monitored during session, Patient requesting pain meds-RN notified, Ice applied    Home Living  Prior Function            PT Goals (current goals can now be found in the care plan section) Acute Rehab PT Goals Patient Stated Goal: to be able to get back to my regular activities and exercise classes to be able to walk t[employer] without fatigue or pain PT Goal Formulation: With patient Time For Goal Achievement: 03/08/23 Potential  to Achieve Goals: Good Progress towards PT goals: Progressing toward goals    Frequency    7X/week      PT Plan Current plan remains appropriate    Co-evaluation              AM-PAC PT "6 Clicks" Mobility   Outcome Measure  Help needed turning from your back to your side while in a flat bed without using bedrails?: None Help needed moving from lying on your back to sitting on the side of a flat bed without using bedrails?: A Little Help needed moving to and from a bed to a chair (including a wheelchair)?: None Help needed standing up from a chair using your arms (e.g., wheelchair or bedside chair)?: None Help needed to walk in hospital room?: None Help needed climbing 3-5 steps with a railing? : A Little 6 Click Score: 22    End of Session Equipment Utilized During Treatment: Gait belt Activity Tolerance: Patient tolerated treatment well Patient left: with call bell/phone within reach;in chair;with chair alarm set Nurse Communication: Mobility status PT Visit Diagnosis: Unsteadiness on feet (R26.81);Other abnormalities of gait and mobility (R26.89);Muscle weakness (generalized) (M62.81);Pain Pain - Right/Left: Left Pain - part of body: Knee;Leg     Time: 1610-9604 PT Time Calculation (min) (ACUTE ONLY): 15 min  Charges:  $Gait Training: 8-22 mins                    Ralene Bathe Kistler PT 02/23/2023  Acute Rehabilitation Services  Office [phone]

## 2023-02-23 NOTE — Progress Notes (Signed)
Patient discharged to home w/ family. Given all belongings, instructions, equipment. Verbalized understanding of instructions. Escorted to pov via w/c. 

## 2023-02-23 NOTE — Progress Notes (Addendum)
     Subjective:  POD 1 s/p LEFT TKR.  Patient reports pain as mild to moderate.  Slept okay.  Nausea and drowsiness following surgery and oxycodone yesterday after surgery, noted in PT eval as well.  Stayed overnight due to this.  Much better today.  Requesting a different pain medicine upon DC, would like to go home today.  Denies numbness or tingling.  No other complaints.  Objective:   VITALS:   Vitals:   02/22/23 1923 02/22/23 2133 02/23/23 0208 02/23/23 0548  BP: (!) 146/86 (!) 142/87 (!) 140/76 124/80  Pulse: 85 82 63 70  Resp: 20 17 18 16   Temp: 98 F (36.7 C) 98.1 F (36.7 C) 98 F (36.7 C) 97.7 F (36.5 C)  TempSrc: Oral  Oral Oral  SpO2: 94% 93% 97% 96%  Weight:      Height:        Neurovascular intact Intact pulses distally Dorsiflexion/Plantar flexion intact Incision: dressing C/D/I Compartment soft Wiggles toes appropriately   Lab Results  Component Value Date   WBC 10.5 02/23/2023   HGB 11.3 (L) 02/23/2023   HCT 34.8 (L) 02/23/2023   MCV 87.0 02/23/2023   PLT 261 02/23/2023   BMET    Component Value Date/Time   NA 138 02/23/2023 0327   K 3.3 (L) 02/23/2023 0327   CL 104 02/23/2023 0327   CO2 25 02/23/2023 0327   GLUCOSE 133 (H) 02/23/2023 0327   BUN 16 02/23/2023 0327   CREATININE 0.97 02/23/2023 0327   CALCIUM 8.5 (L) 02/23/2023 0327   GFRNONAA >60 02/23/2023 0327      Xray: components in good position without adverse features.  Assessment/Plan: 1 Day Post-Op   Principal Problem:   Localized osteoarthritis of left knee Active Problems:   Status post left knee replacement   S/P Left TKR  Labs reviewed, mild expected post-operative anemia  Post op recs: WB: WBAT Abx: ancef Imaging: PACU xrays DVT prophylaxis: Aspirin 81mg  BID x4 weeks Follow up: 2 weeks after surgery for a wound check with Dr. Blanchie Dessert at [facility].  Address: [address], [place], [place] [address]  Office Phone: [phone]    Cecil Cobbs 02/23/2023, 7:18 AM   Weber Cooks, MD  Contact information:   [phone] 7am-5pm epic message Dr. Blanchie Dessert, or call office for patient follow up: [phone] After hours and holidays please check [email or web address] for group call information for [facility]

## 2023-02-23 NOTE — Discharge Summary (Signed)
Physician Discharge Summary  Patient ID: Mariah Holt MRN: [id number] DOB/AGE: [birth year] 72 y.o.  Admit date: 02/22/2023 Discharge date: 02/23/2023  Admission Diagnoses:  Localized osteoarthritis of left knee  Discharge Diagnoses:  Principal Problem:   Localized osteoarthritis of left knee Active Problems:   Status post left knee replacement   Past Medical History:  Diagnosis Date   Anxiety    Aortic aneurysm (HCC)    Arthritis    Family history of breast cancer    GERD (gastroesophageal reflux disease)    H/O benign breast biopsy    High cholesterol    Hypertension     Surgeries: Procedure(s): TOTAL KNEE ARTHROPLASTY on 02/22/2023   Consultants (if any):   Discharged Condition: Improved  Hospital Course: Mariah Holt is an 72 y.o. female who was admitted 02/22/2023 with a diagnosis of Localized osteoarthritis of left knee and went to the operating room on 02/22/2023 and underwent the above named procedures.    She was given perioperative antibiotics:  Anti-infectives (From admission, onward)    Start     Dose/Rate Route Frequency Ordered Stop   02/22/23 1830  ceFAZolin (ANCEF) IVPB 2g/100 mL premix        2 g 200 mL/hr over 30 Minutes Intravenous Every 6 hours 02/22/23 1714 02/23/23 0029   02/22/23 1738  ceFAZolin (ANCEF) 2-4 GM/100ML-% IVPB       Note to Pharmacy: Darien Ramus N: cabinet override      02/22/23 1738 02/22/23 1740   02/22/23 0900  ceFAZolin (ANCEF) IVPB 2g/100 mL premix        2 g 200 mL/hr over 30 Minutes Intravenous On call to O.R. 02/22/23 4098 02/22/23 1149     .  She was given sequential compression devices, early ambulation, and ASA for DVT prophylaxis.  She had some mild post-operative grogginess which improved with overnight stay and conservative measures.  Patient benefited maximally from the hospital stay and there were no complications.    Recent vital signs:  Vitals:   02/23/23 0548 02/23/23 0950  BP: 124/80 115/63   Pulse: 70 72  Resp: 16 16  Temp: 97.7 F (36.5 C) 98 F (36.7 C)  SpO2: 96% 97%    Recent laboratory studies:  Lab Results  Component Value Date   HGB 11.3 (L) 02/23/2023   HGB 13.6 02/09/2023   HGB 13.5 01/10/2021   Lab Results  Component Value Date   WBC 10.5 02/23/2023   PLT 261 02/23/2023   Lab Results  Component Value Date   INR 1.08 03/23/2011   Lab Results  Component Value Date   NA 138 02/23/2023   K 3.3 (L) 02/23/2023   CL 104 02/23/2023   CO2 25 02/23/2023   BUN 16 02/23/2023   CREATININE 0.97 02/23/2023   GLUCOSE 133 (H) 02/23/2023    Discharge Medications:   Allergies as of 02/23/2023       Reactions   Atorvastatin Calcium    Aches and pains   Docosahexaenoic Acid (dha)    Unknown reaction   Ezetimibe    Unknown reaction   Niacin    Unknown reaction   Simvastatin    Aches and pains   Omeprazole Palpitations        Medication List     STOP taking these medications    acetaminophen 650 MG CR tablet Commonly known as: TYLENOL   ibuprofen 200 MG tablet Commonly known as: ADVIL   naproxen sodium 220 MG tablet Commonly known as: ALEVE  TAKE these medications    ALPRAZolam 0.25 MG tablet Commonly known as: XANAX Take 0.25 mg by mouth at bedtime as needed for anxiety or sleep.   aspirin EC 81 MG tablet Take 1 tablet (81 mg total) by mouth 2 (two) times daily for 28 days. Swallow whole.   celecoxib 100 MG capsule Commonly known as: CeleBREX Take 1 capsule (100 mg total) by mouth 2 (two) times daily for 14 days.   famotidine 20 MG tablet Commonly known as: PEPCID Take 20 mg by mouth daily as needed for heartburn or indigestion.   losartan-hydrochlorothiazide 50-12.5 MG tablet Commonly known as: HYZAAR Take 1 tablet by mouth daily.   methocarbamol 500 MG tablet Commonly known as: ROBAXIN Take 1 tablet (500 mg total) by mouth every 8 (eight) hours as needed for up to 10 days for muscle spasms.   metoprolol succinate  25 MG 24 hr tablet Commonly known as: TOPROL-XL Take 1 tablet by mouth daily.   ondansetron 4 MG tablet Commonly known as: Zofran Take 1 tablet (4 mg total) by mouth every 8 (eight) hours as needed for up to 14 days for nausea or vomiting.   pantoprazole 40 MG tablet Commonly known as: PROTONIX Take 1 tablet (40 mg total) by mouth daily for 21 days.   polyethylene glycol 17 g packet Commonly known as: MiraLax Take 17 g by mouth daily.   Repatha 140 MG/ML Sosy Generic drug: Evolocumab Inject 140 mg into the skin every 14 (fourteen) days.        Diagnostic Studies: DG Knee Left Port  Result Date: 02/22/2023 CLINICAL DATA:  Left knee replacement. EXAM: PORTABLE LEFT KNEE - 1-2 VIEW COMPARISON:  None Available. FINDINGS: Left knee arthroplasty in expected alignment. No periprosthetic lucency or fracture. There has been patellar resurfacing. Recent postsurgical change includes air and edema in the soft tissues and joint space. IMPRESSION: Left knee arthroplasty without immediate postoperative complication. Electronically Signed   By: Narda Rutherford M.D.   On: 02/22/2023 15:02    Disposition: Discharge disposition: 01-Home or Self Care       Discharge Instructions     Call MD / Call 911   Complete by: As directed    If you experience chest pain or shortness of breath, CALL 911 and be transported to the hospital emergency room.  If you develope a fever above 101 F, pus (white drainage) or increased drainage or redness at the wound, or calf pain, call your surgeon's office.   Call MD / Call 911   Complete by: As directed    If you experience chest pain or shortness of breath, CALL 911 and be transported to the hospital emergency room.  If you develope a fever above 101 F, pus (white drainage) or increased drainage or redness at the wound, or calf pain, call your surgeon's office.   Constipation Prevention   Complete by: As directed    Drink plenty of fluids.  Prune juice may be  helpful.  You may use a stool softener, such as Colace (over the counter) 100 mg twice a day.  Use MiraLax (over the counter) for constipation as needed.   Constipation Prevention   Complete by: As directed    Drink plenty of fluids.  Prune juice may be helpful.  You may use a stool softener, such as Colace (over the counter) 100 mg twice a day.  Use MiraLax (over the counter) for constipation as needed.   Diet - low sodium heart healthy  Complete by: As directed    Do not put a pillow under the knee. Place it under the heel.   Complete by: As directed    Do not put a pillow under the knee. Place it under the heel.   Complete by: As directed    Driving restrictions   Complete by: As directed    No driving for minimum of 2 weeks   Driving restrictions   Complete by: As directed    No driving for minimum of 2 weeks   Increase activity slowly as tolerated   Complete by: As directed    Increase activity slowly as tolerated   Complete by: As directed    Post-operative opioid taper instructions:   Complete by: As directed    POST-OPERATIVE OPIOID TAPER INSTRUCTIONS: It is important to wean off of your opioid medication as soon as possible. If you do not need pain medication after your surgery it is ok to stop day one. Opioids include: Codeine, Hydrocodone(Norco, Vicodin), Oxycodone(Percocet, oxycontin) and hydromorphone amongst others.  Long term and even short term use of opiods can cause: Increased pain response Dependence Constipation Depression Respiratory depression And more.  Withdrawal symptoms can include Flu like symptoms Nausea, vomiting And more Techniques to manage these symptoms Hydrate well Eat regular healthy meals Stay active Use relaxation techniques(deep breathing, meditating, yoga) Do Not substitute Alcohol to help with tapering If you have been on opioids for less than two weeks and do not have pain than it is ok to stop all together.  Plan to wean off of  opioids This plan should start within one week post op of your joint replacement. Maintain the same interval or time between taking each dose and first decrease the dose.  Cut the total daily intake of opioids by one tablet each day Next start to increase the time between doses. The last dose that should be eliminated is the evening dose.      Post-operative opioid taper instructions:   Complete by: As directed    POST-OPERATIVE OPIOID TAPER INSTRUCTIONS: It is important to wean off of your opioid medication as soon as possible. If you do not need pain medication after your surgery it is ok to stop day one. Opioids include: Codeine, Hydrocodone(Norco, Vicodin), Oxycodone(Percocet, oxycontin) and hydromorphone amongst others.  Long term and even short term use of opiods can cause: Increased pain response Dependence Constipation Depression Respiratory depression And more.  Withdrawal symptoms can include Flu like symptoms Nausea, vomiting And more Techniques to manage these symptoms Hydrate well Eat regular healthy meals Stay active Use relaxation techniques(deep breathing, meditating, yoga) Do Not substitute Alcohol to help with tapering If you have been on opioids for less than two weeks and do not have pain than it is ok to stop all together.  Plan to wean off of opioids This plan should start within one week post op of your joint replacement. Maintain the same interval or time between taking each dose and first decrease the dose.  Cut the total daily intake of opioids by one tablet each day Next start to increase the time between doses. The last dose that should be eliminated is the evening dose.      TED hose   Complete by: As directed    Use stockings (TED hose) for 2 weeks on both leg(s).  Then for 2 more weeks on the surgical leg.  You may remove them at night for sleeping.   TED hose   Complete by:  As directed    Use stockings (TED hose) for 2 weeks on both leg(s).   Then for 2 more weeks on the surgical leg.  You may remove them at night for sleeping.        Follow-up Information     Joen Laura, MD Follow up in 2 week(s).   Specialty: Orthopedic Surgery Contact information: [address] [place] [address] [phone]                    Discharge Instructions      INSTRUCTIONS AFTER JOINT REPLACEMENT   Remove items at home which could result in a fall. This includes throw rugs or furniture in walking pathways ICE to the affected joint every three hours while awake for 30 minutes at a time, for at least the first 3-5 days, and then as needed for pain and swelling.  Continue to use ice for pain and swelling. You may notice swelling that will progress down to the foot and ankle.  This is normal after surgery.  Elevate your leg when you are not up walking on it.   Continue to use the breathing machine you got in the hospital (incentive spirometer) which will help keep your temperature down.  It is common for your temperature to cycle up and down following surgery, especially at night when you are not up moving around and exerting yourself.  The breathing machine keeps your lungs expanded and your temperature down.   DIET:  As you were doing prior to hospitalization, we recommend a well-balanced diet.  DRESSING / WOUND CARE / SHOWERING  Keep the surgical dressing until follow up.  The dressing is water proof, so you can shower without any extra covering.  IF THE DRESSING FALLS OFF or the wound gets wet inside, change the dressing with sterile gauze.  Please use good hand washing techniques before changing the dressing.  Do not use any lotions or creams on the incision until instructed by your surgeon.    ACTIVITY  Increase activity slowly as tolerated, but follow the weight bearing instructions below.   No driving for 6 weeks or until further direction given by your physician.  You cannot drive while taking  narcotics.  No lifting or carrying greater than 10 lbs. until further directed by your surgeon. Avoid periods of inactivity such as sitting longer than an hour when not asleep. This helps prevent blood clots.  You may return to work once you are authorized by your doctor.     WEIGHT BEARING   Weight bearing as tolerated with assist device (walker, cane, etc) as directed, use it as long as suggested by your surgeon or therapist, typically at least 4-6 weeks.   EXERCISES  Results after joint replacement surgery are often greatly improved when you follow the exercise, range of motion and muscle strengthening exercises prescribed by your doctor. Safety measures are also important to protect the joint from further injury. Any time any of these exercises cause you to have increased pain or swelling, decrease what you are doing until you are comfortable again and then slowly increase them. If you have problems or questions, call your caregiver or physical therapist for advice.   Rehabilitation is important following a joint replacement. After just a few days of immobilization, the muscles of the leg can become weakened and shrink (atrophy).  These exercises are designed to build up the tone and strength of the thigh and leg muscles and  to improve motion. Often times heat used for twenty to thirty minutes before working out will loosen up your tissues and help with improving the range of motion but do not use heat for the first two weeks following surgery (sometimes heat can increase post-operative swelling).   These exercises can be done on a training (exercise) mat, on the floor, on a table or on a bed. Use whatever works the best and is most comfortable for you.    Use music or television while you are exercising so that the exercises are a pleasant break in your day. This will make your life better with the exercises acting as a break in your routine that you can look forward to.   Perform all  exercises about fifteen times, three times per day or as directed.  You should exercise both the operative leg and the other leg as well.  Exercises include:   Quad Sets - Tighten up the muscle on the front of the thigh (Quad) and hold for 5-10 seconds.   Straight Leg Raises - With your knee straight (if you were given a brace, keep it on), lift the leg to 60 degrees, hold for 3 seconds, and slowly lower the leg.  Perform this exercise against resistance later as your leg gets stronger.  Leg Slides: Lying on your back, slowly slide your foot toward your buttocks, bending your knee up off the floor (only go as far as is comfortable). Then slowly slide your foot back down until your leg is flat on the floor again.  Angel Wings: Lying on your back spread your legs to the side as far apart as you can without causing discomfort.  Hamstring Strength:  Lying on your back, push your heel against the floor with your leg straight by tightening up the muscles of your buttocks.  Repeat, but this time bend your knee to a comfortable angle, and push your heel against the floor.  You may put a pillow under the heel to make it more comfortable if necessary.   A rehabilitation program following joint replacement surgery can speed recovery and prevent re-injury in the future due to weakened muscles. Contact your doctor or a physical therapist for more information on knee rehabilitation.    CONSTIPATION  Constipation is defined medically as fewer than three stools per week and severe constipation as less than one stool per week.  Even if you have a regular bowel pattern at home, your normal regimen is likely to be disrupted due to multiple reasons following surgery.  Combination of anesthesia, postoperative narcotics, change in appetite and fluid intake all can affect your bowels.   YOU MUST use at least one of the following options; they are listed in order of increasing strength to get the job done.  They are all  available over the counter, and you may need to use some, POSSIBLY even all of these options:    Drink plenty of fluids (prune juice may be helpful) and high fiber foods Colace 100 mg by mouth twice a day  Senokot for constipation as directed and as needed Dulcolax (bisacodyl), take with full glass of water  Miralax (polyethylene glycol) once or twice a day as needed.  If you have tried all these things and are unable to have a bowel movement in the first 3-4 days after surgery call either your surgeon or your primary doctor.    If you experience loose stools or diarrhea, hold the medications until you stool forms  back up.  If your symptoms do not get better within 1 week or if they get worse, check with your doctor.  If you experience "the worst abdominal pain ever" or develop nausea or vomiting, please contact the office immediately for further recommendations for treatment.   ITCHING:  If you experience itching with your medications, try taking only a single pain pill, or even half a pain pill at a time.  You can also use Benadryl over the counter for itching or also to help with sleep.   TED HOSE STOCKINGS:  Use stockings on both legs until for at least 2 weeks or as directed by physician office. They may be removed at night for sleeping.  MEDICATIONS:  See your medication summary on the "After Visit Summary" that nursing will review with you.  You may have some home medications which will be placed on hold until you complete the course of blood thinner medication.  It is important for you to complete the blood thinner medication as prescribed.   Blood clot prevention (DVT Prophylaxis): After surgery you are at an increased risk for a blood clot. you were prescribed a blood thinner, Aspirin 81mg , to be taken twice daily for a total of 4 weeks from surgery to help reduce your risk of getting a blood clot. This will help prevent a blood clot. Signs of a pulmonary embolus (blood clot in the  lungs) include sudden short of breath, feeling lightheaded or dizzy, chest pain with a deep breath, rapid pulse rapid breathing. Signs of a blood clot in your arms or legs include new unexplained swelling and cramping, warm, red or darkened skin around the painful area. Please call the office or 911 right away if these signs or symptoms develop.  PRECAUTIONS:  If you experience chest pain or shortness of breath - call 911 immediately for transfer to the hospital emergency department.   If you develop a fever greater that 101 F, purulent drainage from wound, increased redness or drainage from wound, foul odor from the wound/dressing, or calf pain - CONTACT YOUR SURGEON.                                                   FOLLOW-UP APPOINTMENTS:  If you do not already have a post-op appointment, please call the office for an appointment to be seen by your surgeon.  Guidelines for how soon to be seen are listed in your "After Visit Summary", but are typically between 2-3 weeks after surgery.  OTHER INSTRUCTIONS:   Knee Replacement:  Do not place pillow under knee, focus on keeping the knee straight while resting. CPM instructions: 0-90 degrees, 2 hours in the morning, 2 hours in the afternoon, and 2 hours in the evening. Place foam block, curve side up under heel at all times except when in CPM or when walking.  DO NOT modify, tear, cut, or change the foam block in any way.  POST-OPERATIVE OPIOID TAPER INSTRUCTIONS: It is important to wean off of your opioid medication as soon as possible. If you do not need pain medication after your surgery it is ok to stop day one. Opioids include: Codeine, Hydrocodone(Norco, Vicodin), Oxycodone(Percocet, oxycontin) and hydromorphone amongst others.  Long term and even short term use of opiods can cause: Increased pain response Dependence Constipation Depression Respiratory depression And more.  Withdrawal  symptoms can include Flu like symptoms Nausea,  vomiting And more Techniques to manage these symptoms Hydrate well Eat regular healthy meals Stay active Use relaxation techniques(deep breathing, meditating, yoga) Do Not substitute Alcohol to help with tapering If you have been on opioids for less than two weeks and do not have pain than it is ok to stop all together.  Plan to wean off of opioids This plan should start within one week post op of your joint replacement. Maintain the same interval or time between taking each dose and first decrease the dose.  Cut the total daily intake of opioids by one tablet each day Next start to increase the time between doses. The last dose that should be eliminated is the evening dose.   MAKE SURE YOU:  Understand these instructions.  Get help right away if you are not doing well or get worse.    Thank you for letting us be a part of your medical care team.  It is a privilege we respect greatly.  We hope these instructions will help you stay on track for a fast and full recovery!            Signed: Cecil Cobbs 02/23/2023, 6:13 PM

## 2023-02-23 NOTE — Progress Notes (Signed)
Physical Therapy Treatment Patient Details Name: Mariah Holt MRN: [id number] DOB: October 30, 1950 Today's Date: 02/23/2023   History of Present Illness 72 yo female presents to therapy s/p L TKA on 02/22/2023 due to failure of conservative measures. Pt PMH includes but is not limited to: aortic aneurysm, GERD, HDL, and HTN.    PT Comments    Pt is progressing well with mobility, she ambulated 130' with RW, no buckling nor loss of balance. Reviewed TKA HEP with pt, she demonstrates good understanding. Will plan to do stair training later today, then I expect she'll be ready to DC home from a PT standpoint.     Recommendations for follow up therapy are one component of a multi-disciplinary discharge planning process, led by the attending physician.  Recommendations may be updated based on patient status, additional functional criteria and insurance authorization.  Follow Up Recommendations       Assistance Recommended at Discharge Intermittent Supervision/Assistance  Patient can return home with the following A little help with walking and/or transfers;A little help with bathing/dressing/bathroom;Assistance with cooking/housework;Assist for transportation;Help with stairs or ramp for entrance   Equipment Recommendations  Rolling walker (2 wheels) (provided and adjusted at eval)    Recommendations for Other Services       Precautions / Restrictions Precautions Precautions: Knee;Fall Precaution Booklet Issued: Yes (comment) Precaution Comments: reviewed no pillow under knee Restrictions Weight Bearing Restrictions: No     Mobility  Bed Mobility Overal bed mobility: Modified Independent Bed Mobility: Supine to Sit     Supine to sit: Modified independent (Device/Increase time)     General bed mobility comments: used rail, HOB up    Transfers Overall transfer level: Needs assistance Equipment used: Rolling walker (2 wheels) Transfers: Sit to/from Stand Sit to Stand: Min  guard, From elevated surface           General transfer comment: VCs hand placement    Ambulation/Gait Ambulation/Gait assistance: Min guard Gait Distance (Feet): 130 Feet Assistive device: Rolling walker (2 wheels) Gait Pattern/deviations: Step-to pattern Gait velocity: decr     General Gait Details: VCs to roll rather than lift RW, steady, no loss of balance, no buckling   Stairs             Wheelchair Mobility    Modified Rankin (Stroke Patients Only)       Balance Overall balance assessment: Needs assistance Sitting-balance support: Feet supported, Bilateral upper extremity supported Sitting balance-Leahy Scale: Poor     Standing balance support: Bilateral upper extremity supported, During functional activity, Reliant on assistive device for balance Standing balance-Leahy Scale: Poor                              Cognition Arousal/Alertness: Awake/alert Behavior During Therapy: WFL for tasks assessed/performed Overall Cognitive Status: Within Functional Limits for tasks assessed                                 General Comments: has mild stutter, pt reports this is baseline        Exercises Total Joint Exercises Ankle Circles/Pumps: AROM, Both, 20 reps Quad Sets: AAROM, Both, 15 reps, Supine Short Arc Quad: AAROM, Left, 10 reps, Supine Heel Slides: AAROM, Left, 10 reps, Supine Hip ABduction/ADduction: AAROM, Left, 10 reps, Supine Straight Leg Raises: AAROM, Left, 10 reps, Supine Long Arc Quad: AROM, Left, 5 reps, Seated Knee Flexion: AAROM,  Left, 10 reps, Seated Goniometric ROM: 0-40* AAROM L knee    General Comments        Pertinent Vitals/Pain Pain Assessment Pain Score: 4  Pain Location: L leg Pain Descriptors / Indicators: Aching, Constant, Discomfort, Operative site guarding, Throbbing Pain Intervention(s): Limited activity within patient's tolerance, Monitored during session, Premedicated before session, Ice  applied    Home Living                          Prior Function            PT Goals (current goals can now be found in the care plan section) Acute Rehab PT Goals Patient Stated Goal: to be able to get back to my regular activities and exercise classes to be able to walk t/o [employer] without fatigue or pain PT Goal Formulation: With patient Time For Goal Achievement: 03/08/23 Potential to Achieve Goals: Good Progress towards PT goals: Progressing toward goals    Frequency    7X/week      PT Plan Current plan remains appropriate    Co-evaluation              AM-PAC PT "6 Clicks" Mobility   Outcome Measure  Help needed turning from your back to your side while in a flat bed without using bedrails?: A Little Help needed moving from lying on your back to sitting on the side of a flat bed without using bedrails?: A Little Help needed moving to and from a bed to a chair (including a wheelchair)?: A Little Help needed standing up from a chair using your arms (e.g., wheelchair or bedside chair)?: A Little Help needed to walk in hospital room?: A Little Help needed climbing 3-5 steps with a railing? : A Little 6 Click Score: 18    End of Session Equipment Utilized During Treatment: Gait belt Activity Tolerance: Patient tolerated treatment well Patient left: with call bell/phone within reach;in chair;with chair alarm set Nurse Communication: Mobility status PT Visit Diagnosis: Unsteadiness on feet (R26.81);Other abnormalities of gait and mobility (R26.89);Muscle weakness (generalized) (M62.81);Pain Pain - Right/Left: Left Pain - part of body: Knee;Leg     Time: 2956-2130 PT Time Calculation (min) (ACUTE ONLY): 27 min  Charges:  $Gait Training: 8-22 mins $Therapeutic Exercise: 8-22 mins                     Ralene Bathe Kistler PT 02/23/2023  Acute Rehabilitation Services  Office [phone]

## 2023-02-27 ENCOUNTER — Inpatient Hospital Stay
Admission: RE | Admit: 2023-02-27 | Discharge: 2023-02-27 | Disposition: A | Discharge status: Home / Self Care | Payer: Self-pay | Source: Ambulatory Visit

## 2023-02-27 ENCOUNTER — Other Ambulatory Visit (HOSPITAL_COMMUNITY): Payer: Self-pay

## 2023-02-27 DIAGNOSIS — Z1231 Encounter for screening mammogram for malignant neoplasm of breast: Secondary | ICD-10-CM

## 2023-08-21 ENCOUNTER — Ambulatory Visit
Admission: RE | Admit: 2023-08-21 | Discharge: 2023-08-21 | Disposition: A | Discharge status: Home / Self Care | Payer: Medicare Other | Source: Ambulatory Visit | Attending: Nurse Practitioner | Admitting: Nurse Practitioner

## 2023-08-21 VITALS — BP 155/81 | HR 75 | Temp 98.1°F | Resp 18

## 2023-08-21 DIAGNOSIS — N3001 Acute cystitis with hematuria: Secondary | ICD-10-CM | POA: Diagnosis not present

## 2023-08-21 LAB — POCT URINALYSIS DIP (MANUAL ENTRY)
Bilirubin, UA: NEGATIVE
Glucose, UA: NEGATIVE mg/dL
Ketones, POC UA: NEGATIVE mg/dL
Nitrite, UA: NEGATIVE
Spec Grav, UA: 1.025 (ref 1.010–1.025)
Urobilinogen, UA: 0.2 U/dL
pH, UA: 6 (ref 5.0–8.0)

## 2023-08-21 MED ORDER — CEPHALEXIN 500 MG PO CAPS: 500.0000 mg | ORAL_CAPSULE | Freq: Two times a day (BID) | ORAL | 0 refills | Status: AC

## 2023-08-21 NOTE — ED Provider Notes (Signed)
[facility]    CSN: [id number] Arrival date & time: 08/21/23  1217      History   Chief Complaint Chief Complaint  Patient presents with   Urinary Frequency    UTI - Entered by patient    HPI Mariah Holt is a 72 y.o. female.   Patient presents today with 4-day history of burning with urination, increased urinary frequency, suprapubic pressure, and low back pain.  Patient denies urinary urgency, foul urinary odor, hematuria, abdominal pain, fever, nausea, vomiting, and vaginal discharge.  Reports history of UTI, last was years ago.  No concern for STI today.  Patient is postmenopausal.    Past Medical History:  Diagnosis Date   Anxiety    Aortic aneurysm (HCC)    Arthritis    Family history of breast cancer    GERD (gastroesophageal reflux disease)    H/O benign breast biopsy    High cholesterol    Hypertension     Patient Active Problem List   Diagnosis Date Noted   Localized osteoarthritis of left knee 02/22/2023   Status post left knee replacement 02/22/2023    Past Surgical History:  Procedure Laterality Date   ABDOMINAL HYSTERECTOMY     due to leio/DUB   TONSILLECTOMY     TOTAL KNEE ARTHROPLASTY Left 02/22/2023   Procedure: TOTAL KNEE ARTHROPLASTY;  Surgeon: Joen Laura, MD;  Location: [facility];  Service: Orthopedics;  Laterality: Left;    OB History     Gravida  4   Para  2   Term  2   Preterm      AB  2   Living  2      SAB  2   IAB      Ectopic      Multiple      Live Births  2            Home Medications    Prior to Admission medications   Medication Sig Start Date End Date Taking? Authorizing Provider  ALPRAZolam Prudy Feeler) 0.25 MG tablet Take 0.25 mg by mouth at bedtime as needed for anxiety or sleep. 11/28/18  Yes [provider]  cephALEXin (KEFLEX) 500 MG capsule Take 1 capsule (500 mg total) by mouth 2 (two) times daily for 5 days. 08/21/23 08/26/23 Yes Valentino Nose, NP   Evolocumab (REPATHA) 140 MG/ML SOSY Inject 140 mg into the skin every 14 (fourteen) days.   Yes [provider]  famotidine (PEPCID) 20 MG tablet Take 20 mg by mouth daily as needed for heartburn or indigestion.   Yes [provider]  losartan-hydrochlorothiazide (HYZAAR) 50-12.5 MG tablet Take 1 tablet by mouth daily. 05/06/20  Yes [provider]  metoprolol succinate (TOPROL-XL) 25 MG 24 hr tablet Take 1 tablet by mouth daily. 12/23/20  Yes [provider]  pantoprazole (PROTONIX) 40 MG tablet Take 1 tablet (40 mg total) by mouth daily for 21 days. 02/22/23 03/15/23  Cecil Cobbs, PA-C  polyethylene glycol (MIRALAX) 17 g packet Take 17 g by mouth daily. Patient not taking: Reported on 08/21/2023 02/22/23   Cecil Cobbs, PA-C    Family History Family History  Problem Relation Age of Onset   Breast cancer Mother 51    Social History Social History   Tobacco Use   Smoking status: Former   Smokeless tobacco: Never  Vaping Use   Vaping status: Never Used  Substance Use Topics   Alcohol use: Yes  Comment: rarely   Drug use: No     Allergies   Atorvastatin calcium, Docosahexaenoic acid (dha) (fish), Ezetimibe, Niacin, Simvastatin, and Omeprazole   Review of Systems Review of Systems Per HPI  Physical Exam Triage Vital Signs ED Triage Vitals  Encounter Vitals Group     BP 08/21/23 1232 (!) 155/81     Systolic BP Percentile --      Diastolic BP Percentile --      Pulse Rate 08/21/23 1232 75     Resp 08/21/23 1232 18     Temp 08/21/23 1232 98.1 F (36.7 C)     Temp Source 08/21/23 1232 Oral     SpO2 08/21/23 1232 95 %     Weight --      Height --      Head Circumference --      Peak Flow --      Pain Score 08/21/23 1234 2     Pain Loc --      Pain Education --      Exclude from Growth Chart --    No data found.  Updated Vital Signs BP (!) 155/81 (BP Location: Right Arm)   Pulse 75   Temp 98.1 F (36.7 C) (Oral)    Resp 18   SpO2 95%   Visual Acuity Right Eye Distance:   Left Eye Distance:   Bilateral Distance:    Right Eye Near:   Left Eye Near:    Bilateral Near:     Physical Exam Vitals and nursing note reviewed.  Constitutional:      General: She is not in acute distress.    Appearance: She is not toxic-appearing.  Pulmonary:     Effort: Pulmonary effort is normal. No respiratory distress.  Abdominal:     General: Abdomen is flat. Bowel sounds are normal. There is no distension.     Palpations: Abdomen is soft. There is no mass.     Tenderness: There is no abdominal tenderness. There is no right CVA tenderness, left CVA tenderness or guarding.  Skin:    General: Skin is warm and dry.     Coloration: Skin is not jaundiced or pale.     Findings: No erythema.  Neurological:     Mental Status: She is alert and oriented to person, place, and time.     Motor: No weakness.     Gait: Gait normal.  Psychiatric:        Behavior: Behavior is cooperative.      UC Treatments / Results  Labs (all labs ordered are listed, but only abnormal results are displayed) Labs Reviewed  POCT URINALYSIS DIP (MANUAL ENTRY) - Abnormal; Notable for the following components:      Result Value   Clarity, UA cloudy (*)    Blood, UA trace-lysed (*)    Protein Ur, POC trace (*)    Leukocytes, UA Small (1+) (*)    All other components within normal limits  URINE CULTURE    EKG   Radiology No results found.  Procedures Procedures (including critical care time)  Medications Ordered in UC Medications - No data to display  Initial Impression / Assessment and Plan / UC Course  I have reviewed the triage vital signs and the nursing notes.  Pertinent labs & imaging results that were available during my care of the patient were reviewed by me and considered in my medical decision making (see chart for details).   Patient is well-appearing, afebrile, not tachycardic,  not tachypneic, oxygenating  well on room air.  Patient is mildly hypertensive in triage today.  1. Acute cystitis with hematuria Urinalysis is cloudy with small leukocyte Estrace Urine culture pending Treat with Keflex 500 mg twice daily for 5 days Encouraged continued plenty of water intake Return and ER precautions discussed  The patient was given the opportunity to ask questions.  All questions answered to their satisfaction.  The patient is in agreement to this plan.    Final Clinical Impressions(s) / UC Diagnoses   Final diagnoses:  Acute cystitis with hematuria     Discharge Instructions      Take the Keflex as prescribed to treat the UTI.  We will contact you if the urine culture shows that we need to change the antibiotic.  Continue drinking plenty of water.      ED Prescriptions     Medication Sig Dispense Auth. Provider   cephALEXin (KEFLEX) 500 MG capsule Take 1 capsule (500 mg total) by mouth 2 (two) times daily for 5 days. 10 capsule Valentino Nose, NP      PDMP not reviewed this encounter.   Valentino Nose, NP 08/21/23 1308

## 2023-08-21 NOTE — Discharge Instructions (Signed)
Take the Keflex as prescribed to treat the UTI.  We will contact you if the urine culture shows that we need to change the antibiotic.  Continue drinking plenty of water.

## 2023-08-21 NOTE — ED Triage Notes (Signed)
Lower abdominal and back pain, frequent urination, slight burning with urination that started Friday.

## 2023-08-23 LAB — URINE CULTURE: Culture: 30000 — AB

## 2023-09-17 ENCOUNTER — Ambulatory Visit
Admission: RE | Admit: 2023-09-17 | Discharge: 2023-09-17 | Disposition: A | Discharge status: Home / Self Care | Payer: Medicare Other | Source: Ambulatory Visit | Attending: Nurse Practitioner | Admitting: Nurse Practitioner

## 2023-09-17 VITALS — BP 115/75 | HR 103 | Temp 98.4°F | Resp 20

## 2023-09-17 DIAGNOSIS — Z1152 Encounter for screening for COVID-19: Secondary | ICD-10-CM | POA: Diagnosis not present

## 2023-09-17 DIAGNOSIS — B349 Viral infection, unspecified: Secondary | ICD-10-CM | POA: Diagnosis not present

## 2023-09-17 LAB — POCT INFLUENZA A/B
Influenza A, POC: NEGATIVE
Influenza B, POC: NEGATIVE

## 2023-09-17 MED ORDER — PROMETHAZINE-DM 6.25-15 MG/5ML PO SYRP: 5.0000 mL | ORAL_SOLUTION | Freq: Four times a day (QID) | ORAL | 0 refills | Status: AC | PRN

## 2023-09-17 MED ORDER — FLUTICASONE PROPIONATE 50 MCG/ACT NA SUSP: 2.0000 | Freq: Every day | NASAL | 0 refills | Status: AC

## 2023-09-17 NOTE — ED Provider Notes (Signed)
 [facility]    CSN: [id number] Arrival date & time: 09/17/23  1058      History   Chief Complaint Chief Complaint  Patient presents with   Cough    Entered by patient    HPI Mariah Holt is a 73 y.o. female.   The history is provided by the patient.   Patient presents with a 3-day history of fatigue, body aches, chills, nasal congestion, chest congestion, cough, nausea, and diarrhea.  She states that she did have 1 episode of nausea, and 1 episode of diarrhea, which is since improved.  Denies fever, chills, ear pain, headache, sore throat, wheezing, difficulty breathing, chest pain, abdominal pain, nausea, vomiting, diarrhea, or rash.  States that she does feel short of breath when she moves around a lot.  Patient reports that she has tried over-the-counter medication for symptoms.  Denies any obvious known sick contacts.  Past Medical History:  Diagnosis Date   Anxiety    Aortic aneurysm (HCC)    Arthritis    Family history of breast cancer    GERD (gastroesophageal reflux disease)    H/O benign breast biopsy    High cholesterol    Hypertension     Patient Active Problem List   Diagnosis Date Noted   Localized osteoarthritis of left knee 02/22/2023   Status post left knee replacement 02/22/2023    Past Surgical History:  Procedure Laterality Date   ABDOMINAL HYSTERECTOMY     due to leio/DUB   TONSILLECTOMY     TOTAL KNEE ARTHROPLASTY Left 02/22/2023   Procedure: TOTAL KNEE ARTHROPLASTY;  Surgeon: Edna Toribio LABOR, MD;  Location: [facility];  Service: Orthopedics;  Laterality: Left;    OB History     Gravida  4   Para  2   Term  2   Preterm      AB  2   Living  2      SAB  2   IAB      Ectopic      Multiple      Live Births  2            Home Medications    Prior to Admission medications   Medication Sig Start Date End Date Taking? Authorizing Provider  fluticasone  (FLONASE ) 50 MCG/ACT nasal spray Place 2 sprays  into both nostrils daily. 09/17/23  Yes Leath-Warren, Etta PARAS, NP  promethazine -dextromethorphan (PROMETHAZINE -DM) 6.25-15 MG/5ML syrup Take 5 mLs by mouth 4 (four) times daily as needed. 09/17/23  Yes Leath-Warren, Etta PARAS, NP  ALPRAZolam  (XANAX ) 0.25 MG tablet Take 0.25 mg by mouth at bedtime as needed for anxiety or sleep. 11/28/18   [provider]  Evolocumab (REPATHA) 140 MG/ML SOSY Inject 140 mg into the skin every 14 (fourteen) days.    [provider]  famotidine  (PEPCID ) 20 MG tablet Take 20 mg by mouth daily as needed for heartburn or indigestion.    [provider]  losartan -hydrochlorothiazide  (HYZAAR) 50-12.5 MG tablet Take 1 tablet by mouth daily. 05/06/20   [provider]  metoprolol  succinate (TOPROL -XL) 25 MG 24 hr tablet Take 1 tablet by mouth daily. 12/23/20   [provider]  pantoprazole  (PROTONIX ) 40 MG tablet Take 1 tablet (40 mg total) by mouth daily for 21 days. 02/22/23 03/15/23  Renae Bernarda HERO, PA-C  polyethylene glycol (MIRALAX ) 17 g packet Take 17 g by mouth daily. Patient not taking: Reported on 08/21/2023 02/22/23   Renae Bernarda HERO, PA-C  Family History Family History  Problem Relation Age of Onset   Breast cancer Mother 51    Social History Social History   Tobacco Use   Smoking status: Former   Smokeless tobacco: Never  [employer]   Vaping status: Never Used  Substance Use Topics   Alcohol  use: Yes    Comment: rarely   Drug use: No     Allergies   Atorvastatin calcium, Docosahexaenoic acid (dha) (fish), Ezetimibe, Niacin, Simvastatin, and Omeprazole   Review of Systems Review of Systems Per HPI  Physical Exam Triage Vital Signs ED Triage Vitals  Encounter Vitals Group     BP 09/17/23 1108 115/75     Systolic BP Percentile --      Diastolic BP Percentile --      Pulse Rate 09/17/23 1108 (!) 103     Resp 09/17/23 1108 20     Temp 09/17/23 1108 98.4 F (36.9 C)     Temp Source  09/17/23 1107 Oral     SpO2 09/17/23 1108 93 %     Weight --      Height --      Head Circumference --      Peak Flow --      Pain Score 09/17/23 1109 5     Pain Loc --      Pain Education --      Exclude from Growth Chart --    No data found.  Updated Vital Signs BP 115/75 (BP Location: Right Arm)   Pulse (!) 103   Temp 98.4 F (36.9 C) (Oral)   Resp 20   SpO2 93%   Visual Acuity Right Eye Distance:   Left Eye Distance:   Bilateral Distance:    Right Eye Near:   Left Eye Near:    Bilateral Near:     Physical Exam Vitals and nursing note reviewed.  Constitutional:      General: She is not in acute distress.    Appearance: Normal appearance.  HENT:     Head: Normocephalic.     Right Ear: Tympanic membrane, ear canal and external ear normal.     Left Ear: Tympanic membrane, ear canal and external ear normal.     Nose: Congestion present.     Right Turbinates: Enlarged and swollen.     Left Turbinates: Enlarged and swollen.     Right Sinus: No maxillary sinus tenderness or frontal sinus tenderness.     Left Sinus: No maxillary sinus tenderness or frontal sinus tenderness.     Mouth/Throat:     Lips: Pink.     Mouth: Mucous membranes are moist.     Pharynx: Uvula midline. Postnasal drip present. No pharyngeal swelling, oropharyngeal exudate, posterior oropharyngeal erythema or uvula swelling.  Eyes:     Extraocular Movements: Extraocular movements intact.     Conjunctiva/sclera: Conjunctivae normal.     Pupils: Pupils are equal, round, and reactive to light.  Cardiovascular:     Rate and Rhythm: Regular rhythm. Tachycardia present.     Pulses: Normal pulses.     Heart sounds: Normal heart sounds.  Pulmonary:     Effort: Pulmonary effort is normal. No respiratory distress.     Breath sounds: Normal breath sounds. No stridor. No wheezing, rhonchi or rales.  Abdominal:     General: Bowel sounds are normal.     Palpations: Abdomen is soft.     Tenderness: There  is no abdominal tenderness.  Musculoskeletal:  Cervical back: Normal range of motion.  Lymphadenopathy:     Cervical: No cervical adenopathy.  Skin:    General: Skin is warm and dry.  Neurological:     General: No focal deficit present.     Mental Status: She is alert and oriented to person, place, and time.  Psychiatric:        Mood and Affect: Mood normal.        Behavior: Behavior normal.      UC Treatments / Results  Labs (all labs ordered are listed, but only abnormal results are displayed) Labs Reviewed  SARS CORONAVIRUS 2 (TAT 6-24 HRS)  POCT INFLUENZA A/B    EKG   Radiology No results found.  Procedures Procedures (including critical care time)  Medications Ordered in UC Medications - No data to display  Initial Impression / Assessment and Plan / UC Course  I have reviewed the triage vital signs and the nursing notes.  Pertinent labs & imaging results that were available during my care of the patient were reviewed by me and considered in my medical decision making (see chart for details).  On exam, lung sounds are clear throughout, patient is well-appearing, vital signs are stable.  Influenza test was negative.  COVID test is pending.  Patient is able to receive Paxlovid if her COVID test is positive.  Do suspect a viral illness at this time.  Will provide symptomatic treatment with Promethazine  DM for cough (advised patient of side effects of medication), and fluticasone  50 mcg nasal spray for nasal congestion.  Supportive care recommendations were provided and discussed with the patient to include fluids, rest, over-the-counter Tylenol , and use of a humidifier during sleep.  Discussed indications with the patient regarding follow-up.  Patient was in agreement with this plan of care and verbalizes understanding.  All questions were answered.  Patient stable for discharge.  Final Clinical Impressions(s) / UC Diagnoses   Final diagnoses:  Viral illness   Encounter for screening for COVID-19     Discharge Instructions      The influenza test was negative. COVID test is pending. You will be contacted if the result is positive. You will also have access to the results via MyChart. Take medication as prescribed. Increase fluids and allow for plenty of rest. Recommend Pedialyte or Gatorlyte to prevent dehydration. Recommend Tylenol  as needed for pain, fever, or general discomfort. Recommend using a humidifier at bedtime during sleep and sleeping elevated to help with cough. As discussed, if your results are negative, but symptoms fail to improve, recommend follow-up with your PCP for further evaluation.  Follow-up as needed.     ED Prescriptions     Medication Sig Dispense Auth. Provider   promethazine -dextromethorphan (PROMETHAZINE -DM) 6.25-15 MG/5ML syrup Take 5 mLs by mouth 4 (four) times daily as needed. 118 mL Leath-Warren, Etta PARAS, NP   fluticasone  (FLONASE ) 50 MCG/ACT nasal spray Place 2 sprays into both nostrils daily. 16 g Leath-Warren, Etta PARAS, NP      PDMP not reviewed this encounter.   Gilmer Etta PARAS, NP 09/17/23 1149

## 2023-09-17 NOTE — Discharge Instructions (Addendum)
 The influenza test was negative. COVID test is pending. You will be contacted if the result is positive. You will also have access to the results via MyChart. Take medication as prescribed. Increase fluids and allow for plenty of rest. Recommend Pedialyte or Gatorlyte to prevent dehydration. Recommend Tylenol  as needed for pain, fever, or general discomfort. Recommend using a humidifier at bedtime during sleep and sleeping elevated to help with cough. As discussed, if your results are negative, but symptoms fail to improve, recommend follow-up with your PCP for further evaluation.  Follow-up as needed.

## 2023-09-17 NOTE — ED Triage Notes (Signed)
 Pt reports she has had a cough, chest congestion, fatigue, nausea, 1 spell of diarrhea x 3 days.

## 2023-09-18 LAB — SARS CORONAVIRUS 2 (TAT 6-24 HRS): SARS Coronavirus 2: NEGATIVE

## 2024-01-23 ENCOUNTER — Other Ambulatory Visit (HOSPITAL_COMMUNITY): Payer: Self-pay

## 2024-01-23 ENCOUNTER — Other Ambulatory Visit (HOSPITAL_COMMUNITY): Payer: Self-pay | Admitting: Internal Medicine

## 2024-01-23 DIAGNOSIS — Z1231 Encounter for screening mammogram for malignant neoplasm of breast: Secondary | ICD-10-CM

## 2024-02-22 ENCOUNTER — Encounter (HOSPITAL_COMMUNITY)

## 2024-02-22 DIAGNOSIS — Z1231 Encounter for screening mammogram for malignant neoplasm of breast: Secondary | ICD-10-CM

## 2024-03-06 ENCOUNTER — Encounter (HOSPITAL_COMMUNITY): Payer: Self-pay

## 2024-03-06 ENCOUNTER — Ambulatory Visit (HOSPITAL_COMMUNITY)
Admission: RE | Admit: 2024-03-06 | Discharge: 2024-03-06 | Disposition: A | Discharge status: Home / Self Care | Source: Ambulatory Visit

## 2024-03-06 DIAGNOSIS — Z1231 Encounter for screening mammogram for malignant neoplasm of breast: Secondary | ICD-10-CM | POA: Diagnosis present

## 2024-04-30 ENCOUNTER — Other Ambulatory Visit: Payer: Self-pay

## 2024-04-30 ENCOUNTER — Ambulatory Visit (INDEPENDENT_AMBULATORY_CARE_PROVIDER_SITE_OTHER)

## 2024-04-30 ENCOUNTER — Ambulatory Visit
Admission: RE | Admit: 2024-04-30 | Discharge: 2024-04-30 | Disposition: A | Discharge status: Home / Self Care | Attending: Nurse Practitioner | Admitting: Nurse Practitioner

## 2024-04-30 VITALS — BP 150/86 | HR 70 | Temp 97.9°F | Resp 20

## 2024-04-30 DIAGNOSIS — R059 Cough, unspecified: Secondary | ICD-10-CM | POA: Diagnosis present

## 2024-04-30 DIAGNOSIS — M546 Pain in thoracic spine: Secondary | ICD-10-CM | POA: Diagnosis not present

## 2024-04-30 LAB — POC SOFIA SARS ANTIGEN FIA: SARS Coronavirus 2 Ag: NEGATIVE

## 2024-04-30 MED ORDER — TIZANIDINE HCL 4 MG PO TABS: 4.0000 mg | ORAL_TABLET | Freq: Three times a day (TID) | ORAL | 0 refills | Status: AC | PRN

## 2024-04-30 NOTE — ED Triage Notes (Signed)
 Pt reports cough, nasal congestion for last several days that has improved but reports right sided rib cage pain that is worse with movement. Denies any known fevers. Pt reports I'm concerned for pneumonia.

## 2024-04-30 NOTE — ED Provider Notes (Signed)
 [facility]    CSN: [id number] Arrival date & time: 04/30/24  1050      History   Chief Complaint Chief Complaint  Patient presents with   Cough    HPI Mariah Holt is a 73 y.o. female.   The history is provided by the patient.   Patient presents for complaints of cough has been present for the past 2 to 3 days.  She also endorses pain with movement, and intermittent shortness of breath.  She denies fever, chills, headache, ear pain, wheezing, difficulty breathing, abdominal pain, nausea, vomiting, diarrhea, or rash.  Patient reports I am concern for pneumonia.  Patient complains of pain with movement of the right shoulder, but denies shoulder pain. States that she has had some gas, further denies bloating, diarrhea, constipation, or bloody stools.  So far she has been taking Tylenol  and ibuprofen  for her symptoms.  She denies any obvious close sick contacts.  Past Medical History:  Diagnosis Date   Anxiety    Aortic aneurysm (HCC)    Arthritis    Family history of breast cancer    GERD (gastroesophageal reflux disease)    H/O benign breast biopsy    High cholesterol    Hypertension     Patient Active Problem List   Diagnosis Date Noted   Localized osteoarthritis of left knee 02/22/2023   Status post left knee replacement 02/22/2023    Past Surgical History:  Procedure Laterality Date   ABDOMINAL HYSTERECTOMY     due to leio/DUB   TONSILLECTOMY     TOTAL KNEE ARTHROPLASTY Left 02/22/2023   Procedure: TOTAL KNEE ARTHROPLASTY;  Surgeon: Edna Toribio LABOR, MD;  Location: [facility];  Service: Orthopedics;  Laterality: Left;    OB History     Gravida  4   Para  2   Term  2   Preterm      AB  2   Living  2      SAB  2   IAB      Ectopic      Multiple      Live Births  2            Home Medications    Prior to Admission medications   Medication Sig Start Date End Date Taking? Authorizing Provider  tiZANidine  (ZANAFLEX ) 4  MG tablet Take 1 tablet (4 mg total) by mouth every 8 (eight) hours as needed for muscle spasms. 04/30/24  Yes Leath-Warren, Etta PARAS, NP  ALPRAZolam  (XANAX ) 0.25 MG tablet Take 0.25 mg by mouth at bedtime as needed for anxiety or sleep. 11/28/18   [provider]  Evolocumab (REPATHA) 140 MG/ML SOSY Inject 140 mg into the skin every 14 (fourteen) days.    [provider]  famotidine  (PEPCID ) 20 MG tablet Take 20 mg by mouth daily as needed for heartburn or indigestion.    [provider]  fluticasone  (FLONASE ) 50 MCG/ACT nasal spray Place 2 sprays into both nostrils daily. 09/17/23   Leath-Warren, Etta PARAS, NP  losartan -hydrochlorothiazide  (HYZAAR) 50-12.5 MG tablet Take 1 tablet by mouth daily. 05/06/20   [provider]  metoprolol  succinate (TOPROL -XL) 25 MG 24 hr tablet Take 1 tablet by mouth daily. 12/23/20   [provider]  pantoprazole  (PROTONIX ) 40 MG tablet Take 1 tablet (40 mg total) by mouth daily for 21 days. 02/22/23 03/15/23  Renae Bernarda HERO, PA-C  polyethylene glycol (MIRALAX ) 17 g packet Take 17 g by mouth daily. Patient not taking:  Reported on 08/21/2023 02/22/23   Renae Bernarda HERO, PA-C  promethazine -dextromethorphan (PROMETHAZINE -DM) 6.25-15 MG/5ML syrup Take 5 mLs by mouth 4 (four) times daily as needed. 09/17/23   Leath-Warren, Etta PARAS, NP    Family History Family History  Problem Relation Age of Onset   Breast cancer Mother 3    Social History Social History   Tobacco Use   Smoking status: Former   Smokeless tobacco: Never  Advertising account planner   Vaping status: Never Used  Substance Use Topics   Alcohol  use: Yes    Comment: rarely   Drug use: No     Allergies   Atorvastatin calcium, Docosahexaenoic acid (dha) (fish), Ezetimibe, Niacin, Simvastatin, and Omeprazole   Review of Systems Review of Systems Per HPI  Physical Exam Triage Vital Signs ED Triage Vitals  Encounter Vitals Group     BP 04/30/24 1123 (!)  150/86     Girls Systolic BP Percentile --      Girls Diastolic BP Percentile --      Boys Systolic BP Percentile --      Boys Diastolic BP Percentile --      Pulse Rate 04/30/24 1123 70     Resp 04/30/24 1123 20     Temp 04/30/24 1123 97.9 F (36.6 C)     Temp Source 04/30/24 1123 Oral     SpO2 04/30/24 1123 96 %     Weight --      Height --      Head Circumference --      Peak Flow --      Pain Score 04/30/24 1124 8     Pain Loc --      Pain Education --      Exclude from Growth Chart --    No data found.  Updated Vital Signs BP (!) 150/86 (BP Location: Right Arm)   Pulse 70   Temp 97.9 F (36.6 C) (Oral)   Resp 20   SpO2 96%   Visual Acuity Right Eye Distance:   Left Eye Distance:   Bilateral Distance:    Right Eye Near:   Left Eye Near:    Bilateral Near:     Physical Exam Vitals and nursing note reviewed.  Constitutional:      General: She is not in acute distress.    Appearance: Normal appearance.  HENT:     Head: Normocephalic.  Eyes:     Extraocular Movements: Extraocular movements intact.     Pupils: Pupils are equal, round, and reactive to light.  Cardiovascular:     Rate and Rhythm: Normal rate and regular rhythm.     Pulses: Normal pulses.     Heart sounds: Normal heart sounds.  Pulmonary:     Effort: Pulmonary effort is normal.     Breath sounds: Normal breath sounds.  Abdominal:     General: Bowel sounds are normal.     Palpations: Abdomen is soft.     Tenderness: There is no abdominal tenderness.  Musculoskeletal:     Cervical back: Normal range of motion.     Thoracic back: Tenderness present.       Back:  Skin:    General: Skin is warm and dry.  Neurological:     General: No focal deficit present.     Mental Status: She is alert and oriented to person, place, and time.  Psychiatric:        Mood and Affect: Mood normal.  Behavior: Behavior normal.      UC Treatments / Results  Labs (all labs ordered are listed, but  only abnormal results are displayed) Labs Reviewed  POC SOFIA SARS ANTIGEN FIA    EKG: Normal sinus rhythm, no STEMI.  Compared to EKGs dated 01/11/2021, 01/10/2021, and 05/28/2018.   Radiology DG Chest 2 View Result Date: 04/30/2024 CLINICAL DATA:  Cough, right-sided rib pain. EXAM: CHEST - 2 VIEW COMPARISON:  None Available. FINDINGS: The heart size and mediastinal contours are within normal limits. Both lungs are clear. The visualized skeletal structures are unremarkable. IMPRESSION: No active cardiopulmonary disease. Electronically Signed   By: Lynwood Landy Raddle M.D.   On: 04/30/2024 12:11    Procedures Procedures (including critical care time)  Medications Ordered in UC Medications - No data to display  Initial Impression / Assessment and Plan / UC Course  I have reviewed the triage vital signs and the nursing notes.  Pertinent labs & imaging results that were available during my care of the patient were reviewed by me and considered in my medical decision making (see chart for details).  The chest x-ray is negative for pneumonia.  Suspect symptoms are consistent with musculoskeletal etiology.  Will have patient continue over-the-counter analgesics, will start patient on tizanidine  4 mg to help with pain or discomfort.  Supportive care recommendations were provided and discussed with the patient to include the use of ice or heat, gentle stretching exercises, and to monitor for signs of worsening.  Patient was advised if symptoms fail to improve, recommend follow-up with her PCP for further evaluation.  Patient was also given strict ER follow-up precautions.  Patient was in agreement with this plan of care and verbalizes understanding.  All questions were answered.  Patient stable for discharge  Final Clinical Impressions(s) / UC Diagnoses   Final diagnoses:  Cough, unspecified type  Acute right-sided thoracic back pain     Discharge Instructions      The EKG and chest x-ray were  normal today. Take medication as prescribed.  The medication may cause drowsiness.  No driving, operating heavy equipment, or drinking alcohol  while taking the medication. You may continue over-the-counter Tylenol  as needed for pain or discomfort. Recommend the use of ice or heat as needed for pain or discomfort.  Apply for 20 minutes, remove for 1 hour, repeat as needed. Go to the emergency department immediately if you experience worsening shortness of breath, or develop new symptoms of chest pain, difficulty breathing, or other concerns. Please follow-up with your primary care physician if symptoms fail to improve. Follow-up as needed.     ED Prescriptions     Medication Sig Dispense Auth. Provider   tiZANidine  (ZANAFLEX ) 4 MG tablet Take 1 tablet (4 mg total) by mouth every 8 (eight) hours as needed for muscle spasms. 20 tablet Leath-Warren, Etta PARAS, NP      PDMP not reviewed this encounter.   Gilmer Etta PARAS, NP 04/30/24 1324

## 2024-04-30 NOTE — Discharge Instructions (Addendum)
 The EKG and chest x-ray were normal today. Take medication as prescribed.  The medication may cause drowsiness.  No driving, operating heavy equipment, or drinking alcohol  while taking the medication. You may continue over-the-counter Tylenol  as needed for pain or discomfort. Recommend the use of ice or heat as needed for pain or discomfort.  Apply for 20 minutes, remove for 1 hour, repeat as needed. Go to the emergency department immediately if you experience worsening shortness of breath, or develop new symptoms of chest pain, difficulty breathing, or other concerns. Please follow-up with your primary care physician if symptoms fail to improve. Follow-up as needed.

## 2024-07-25 ENCOUNTER — Other Ambulatory Visit: Payer: Self-pay

## 2024-07-25 ENCOUNTER — Emergency Department (HOSPITAL_COMMUNITY)
Admission: EM | Admit: 2024-07-25 | Discharge: 2024-07-26 | Disposition: A | Discharge status: Home / Self Care | Attending: Emergency Medicine | Admitting: Emergency Medicine

## 2024-07-25 ENCOUNTER — Encounter (HOSPITAL_COMMUNITY): Payer: Self-pay | Admitting: Emergency Medicine

## 2024-07-25 DIAGNOSIS — Z23 Encounter for immunization: Secondary | ICD-10-CM | POA: Diagnosis not present

## 2024-07-25 DIAGNOSIS — W208XXA Other cause of strike by thrown, projected or falling object, initial encounter: Secondary | ICD-10-CM | POA: Diagnosis not present

## 2024-07-25 DIAGNOSIS — Z79899 Other long term (current) drug therapy: Secondary | ICD-10-CM | POA: Diagnosis not present

## 2024-07-25 DIAGNOSIS — S8991XA Unspecified injury of right lower leg, initial encounter: Secondary | ICD-10-CM | POA: Diagnosis present

## 2024-07-25 DIAGNOSIS — S91011A Laceration without foreign body, right ankle, initial encounter: Secondary | ICD-10-CM

## 2024-07-25 DIAGNOSIS — S81811A Laceration without foreign body, right lower leg, initial encounter: Secondary | ICD-10-CM | POA: Diagnosis not present

## 2024-07-25 DIAGNOSIS — I10 Essential (primary) hypertension: Secondary | ICD-10-CM | POA: Diagnosis not present

## 2024-07-25 NOTE — ED Triage Notes (Signed)
 Pt arrived to ED via POV c/o laceration to right ankle after dropping glass bowl on it tonight at about 6pm. Pt placed bandage while at home bleeding controlled at this time.

## 2024-07-25 NOTE — ED Notes (Signed)
Ambulatory to tx room  

## 2024-07-26 ENCOUNTER — Ambulatory Visit

## 2024-07-26 DIAGNOSIS — S81811A Laceration without foreign body, right lower leg, initial encounter: Secondary | ICD-10-CM | POA: Diagnosis not present

## 2024-07-26 MED ORDER — LIDOCAINE-EPINEPHRINE (PF) 1 %-1:200000 IJ SOLN
20.0000 mL | Freq: Once | INTRAMUSCULAR | Status: AC
  Administered 2024-07-26: 20 mL
  Filled 2024-07-26: qty 30

## 2024-07-26 MED ORDER — TETANUS-DIPHTH-ACELL PERTUSSIS 5-2-15.5 LF-MCG/0.5 IM SUSP
0.5000 mL | Freq: Once | INTRAMUSCULAR | Status: AC
  Administered 2024-07-26: 0.5 mL via INTRAMUSCULAR
  Filled 2024-07-26: qty 0.5

## 2024-07-26 NOTE — ED Provider Notes (Signed)
 [facility] Provider Note   CSN: [id number] Arrival date & time: 07/25/24  2223     Patient presents with: Laceration   Mariah Holt is a 73 y.o. female.   The history is provided by the patient.  Laceration  She has history of hypertension, hyperlipidemia, aortic aneurysm, GERD and comes in because she suffered a laceration to her right ankle.  She states the bowl dropped and a piece of it cut her ankle.  She does not know when her last tetanus immunization was.  She denies other injury.    Prior to Admission medications   Medication Sig Start Date End Date Taking? Authorizing Provider  ALPRAZolam  (XANAX ) 0.25 MG tablet Take 0.25 mg by mouth at bedtime as needed for anxiety or sleep. 11/28/18   [provider]  Evolocumab (REPATHA) 140 MG/ML SOSY Inject 140 mg into the skin every 14 (fourteen) days.    [provider]  famotidine  (PEPCID ) 20 MG tablet Take 20 mg by mouth daily as needed for heartburn or indigestion.    [provider]  fluticasone  (FLONASE ) 50 MCG/ACT nasal spray Place 2 sprays into both nostrils daily. 09/17/23   Leath-Warren, Etta PARAS, NP  losartan -hydrochlorothiazide  (HYZAAR) 50-12.5 MG tablet Take 1 tablet by mouth daily. 05/06/20   [provider]  metoprolol  succinate (TOPROL -XL) 25 MG 24 hr tablet Take 1 tablet by mouth daily. 12/23/20   [provider]  pantoprazole  (PROTONIX ) 40 MG tablet Take 1 tablet (40 mg total) by mouth daily for 21 days. 02/22/23 03/15/23  Renae Bernarda HERO, PA-C  polyethylene glycol (MIRALAX ) 17 g packet Take 17 g by mouth daily. Patient not taking: Reported on 08/21/2023 02/22/23   Renae Bernarda HERO, PA-C  promethazine -dextromethorphan (PROMETHAZINE -DM) 6.25-15 MG/5ML syrup Take 5 mLs by mouth 4 (four) times daily as needed. 09/17/23   Leath-Warren, Etta PARAS, NP  tiZANidine  (ZANAFLEX ) 4 MG tablet Take 1 tablet (4 mg total) by mouth every 8  (eight) hours as needed for muscle spasms. 04/30/24   Leath-Warren, Etta PARAS, NP    Allergies: Atorvastatin calcium, Docosahexaenoic acid (dha) (fish), Ezetimibe, Niacin, Simvastatin, and Omeprazole    Review of Systems  All other systems reviewed and are negative.   Updated Vital Signs BP (!) 124/102 (BP Location: Right Arm)   Pulse 73   Temp 98.2 F (36.8 C)   Resp 20   Ht 5' 9 (1.753 m)   Wt 95 kg   SpO2 99%   BMI 30.93 kg/m   Physical Exam Vitals and nursing note reviewed.   73 year old female, resting comfortably and in no acute distress. Vital signs are significant for elevated diastolic blood pressure. Oxygen saturation is 99%, which is normal. Head is normocephalic and atraumatic. . Lungs are clear without rales, wheezes, or rhonchi. Heart has regular rate and rhythm without murmur. Extremities: There is a laceration in the anteromedial aspect of the right ankle.  Distal neurovascular exam is intact with normal sensation, normal tendon function, prompt capillary refill, normal pulses. Skin is warm and dry without rash. Neurologic: Awake and alert, moves all extremities equally.     .Laceration Repair  Date/Time: 07/26/2024 2:02 AM  Performed by: Raford Lenis, MD Authorized by: Raford Lenis, MD   Consent:    Consent obtained:  Verbal   Consent given by:  Patient   Risks, benefits, and alternatives were discussed: yes     Risks discussed:  Infection, pain, poor cosmetic result and poor  wound healing   Alternatives discussed:  No treatment Universal protocol:    Procedure explained and questions answered to patient or proxy's satisfaction: yes     Relevant documents present and verified: yes     Required blood products, implants, devices, and special equipment available: yes     Site/side marked: yes     Immediately prior to procedure, a time out was called: yes     Patient identity confirmed:  Arm band and verbally with patient Anesthesia:    Anesthesia  method:  Local infiltration   Local anesthetic:  Lidocaine  1% WITH epi Laceration details:    Location:  Leg   Leg location:  R lower leg   Length (cm):  3   Depth (mm):  3 Pre-procedure details:    Preparation:  Patient was prepped and draped in usual sterile fashion Exploration:    Limited defect created (wound extended): no     Hemostasis achieved with:  Direct pressure   Wound exploration: entire depth of wound visualized     Wound extent: no foreign body, no signs of injury, no nerve damage, no tendon damage, no underlying fracture and no vascular damage     Contaminated: no   Treatment:    Area cleansed with:  Saline   Amount of cleaning:  Standard   Debridement:  None   Undermining:  None   Scar revision: no   Skin repair:    Repair method:  Sutures   Suture size:  4-0   Suture material:  Nylon   Suture technique:  Simple interrupted   Number of sutures:  6 Approximation:    Approximation:  Close Repair type:    Repair type:  Simple Post-procedure details:    Dressing:  Sterile dressing   Procedure completion:  Tolerated well, no immediate complications    Medications Ordered in the ED  Tdap (ADACEL) injection 0.5 mL (has no administration in time range)  lidocaine -EPINEPHrine (PF) (XYLOCAINE -EPINEPHrine) 1 %-1:200000 (PF) injection 20 mL (has no administration in time range)                                    Medical Decision Making Risk Prescription drug management.   Laceration of the right ankle treated with sutures.  I have reviewed her past records, can find no record of tetanus immunization.  I have ordered a Tdap booster.     Final diagnoses:  Laceration of right ankle, initial encounter    ED Discharge Orders     None          Raford Lenis, MD 07/26/24 [phone]

## 2024-08-03 ENCOUNTER — Ambulatory Visit
Admission: RE | Admit: 2024-08-03 | Discharge: 2024-08-03 | Disposition: A | Discharge status: Home / Self Care | Source: Ambulatory Visit

## 2024-08-03 VITALS — BP 117/76 | HR 73 | Temp 97.5°F | Resp 18

## 2024-08-03 DIAGNOSIS — Z4802 Encounter for removal of sutures: Secondary | ICD-10-CM | POA: Diagnosis not present

## 2024-08-03 DIAGNOSIS — S91011D Laceration without foreign body, right ankle, subsequent encounter: Secondary | ICD-10-CM | POA: Diagnosis not present

## 2024-08-03 NOTE — Discharge Instructions (Addendum)
 Patient declines AVS here.

## 2024-08-03 NOTE — ED Triage Notes (Signed)
 Pt presents to UC for a suture removal of right ankle. Denies any complications to suture site.

## 2024-08-03 NOTE — ED Provider Notes (Signed)
 [facility]    CSN: [id number] Arrival date & time: 08/03/24  0945      History   Chief Complaint Chief Complaint  Patient presents with   Suture / Staple Removal    9th day of 6 stitches on foot area - Entered by patient    HPI Mariah Holt is a 73 y.o. female.   The history is provided by the patient.   Patient presents for suture removal to the right ankle.  Patient had 6 sutures placed in the emergency department on 07/25/2024.  She denies fever, chills, foul-smelling drainage from the site, bruising, swelling, or difficulty ambulating.  Past Medical History:  Diagnosis Date   Anxiety    Aortic aneurysm    Arthritis    Family history of breast cancer    GERD (gastroesophageal reflux disease)    H/O benign breast biopsy    High cholesterol    Hypertension     Patient Active Problem List   Diagnosis Date Noted   Localized osteoarthritis of left knee 02/22/2023   Status post left knee replacement 02/22/2023    Past Surgical History:  Procedure Laterality Date   ABDOMINAL HYSTERECTOMY     due to leio/DUB   TONSILLECTOMY     TOTAL KNEE ARTHROPLASTY Left 02/22/2023   Procedure: TOTAL KNEE ARTHROPLASTY;  Surgeon: Edna Toribio LABOR, MD;  Location: [facility];  Service: Orthopedics;  Laterality: Left;    OB History     Gravida  4   Para  2   Term  2   Preterm      AB  2   Living  2      SAB  2   IAB      Ectopic      Multiple      Live Births  2            Home Medications    Prior to Admission medications   Medication Sig Start Date End Date Taking? Authorizing Provider  ALPRAZolam  (XANAX ) 0.25 MG tablet Take 0.25 mg by mouth at bedtime as needed for anxiety or sleep. 11/28/18   [provider]  Evolocumab (REPATHA) 140 MG/ML SOSY Inject 140 mg into the skin every 14 (fourteen) days.    [provider]  famotidine  (PEPCID ) 20 MG tablet Take 20 mg by mouth daily as needed for heartburn or indigestion.     [provider]  fluticasone  (FLONASE ) 50 MCG/ACT nasal spray Place 2 sprays into both nostrils daily. 09/17/23   Leath-Warren, Etta PARAS, NP  losartan -hydrochlorothiazide  (HYZAAR) 50-12.5 MG tablet Take 1 tablet by mouth daily. 05/06/20   [provider]  metoprolol  succinate (TOPROL -XL) 25 MG 24 hr tablet Take 1 tablet by mouth daily. 12/23/20   [provider]  pantoprazole  (PROTONIX ) 40 MG tablet Take 1 tablet (40 mg total) by mouth daily for 21 days. 02/22/23 03/15/23  Renae Bernarda HERO, PA-C  polyethylene glycol (MIRALAX ) 17 g packet Take 17 g by mouth daily. Patient not taking: Reported on 08/21/2023 02/22/23   Renae Bernarda HERO, PA-C  promethazine -dextromethorphan (PROMETHAZINE -DM) 6.25-15 MG/5ML syrup Take 5 mLs by mouth 4 (four) times daily as needed. 09/17/23   Leath-Warren, Etta PARAS, NP  tiZANidine  (ZANAFLEX ) 4 MG tablet Take 1 tablet (4 mg total) by mouth every 8 (eight) hours as needed for muscle spasms. 04/30/24   Leath-Warren, Etta PARAS, NP    Family History Family History  Problem Relation Age of Onset   Breast cancer Mother  24    Social History Social History   Tobacco Use   Smoking status: Former   Smokeless tobacco: Never  Advertising Account Planner   Vaping status: Never Used  Substance Use Topics   Alcohol  use: Yes    Comment: rarely   Drug use: No     Allergies   Atorvastatin calcium, Docosahexaenoic acid (dha) (fish), Ezetimibe, Niacin, Simvastatin, and Omeprazole   Review of Systems Review of Systems Per HPI  Physical Exam Triage Vital Signs ED Triage Vitals [08/03/24 0951]  Encounter Vitals Group     BP 117/76     Girls Systolic BP Percentile      Girls Diastolic BP Percentile      Boys Systolic BP Percentile      Boys Diastolic BP Percentile      Pulse Rate 73     Resp 18     Temp (!) 97.5 F (36.4 C)     Temp Source Oral     SpO2 94 %     Weight      Height      Head Circumference      Peak Flow      Pain Score       Pain Loc      Pain Education      Exclude from Growth Chart    No data found.  Updated Vital Signs BP 117/76 (BP Location: Right Arm)   Pulse 73   Temp (!) 97.5 F (36.4 C) (Oral)   Resp 18   SpO2 94%   Visual Acuity Right Eye Distance:   Left Eye Distance:   Bilateral Distance:    Right Eye Near:   Left Eye Near:    Bilateral Near:     Physical Exam Vitals and nursing note reviewed.  Constitutional:      General: She is not in acute distress.    Appearance: Normal appearance.  HENT:     Head: Normocephalic.  Eyes:     Extraocular Movements: Extraocular movements intact.     Pupils: Pupils are equal, round, and reactive to light.  Pulmonary:     Effort: Pulmonary effort is normal.  Musculoskeletal:     Cervical back: Normal range of motion.  Skin:    General: Skin is warm and dry.     Findings: Laceration present.     Comments: Approximate 3 cm laceration noted to the right anterior ankle.  6 sutures are in place.  The suture line is intact.  There is no obvious swelling, or foul-smelling drainage from the site.  Localized erythema is present to the laceration and does not extend beyond the laceration..  Neurological:     General: No focal deficit present.     Mental Status: She is alert and oriented to person, place, and time.  Psychiatric:        Mood and Affect: Mood normal.        Behavior: Behavior normal.      UC Treatments / Results  Labs (all labs ordered are listed, but only abnormal results are displayed) Labs Reviewed - No data to display  EKG   Radiology No results found.  Procedures Procedures (including critical care time)  Medications Ordered in UC Medications - No data to display  Initial Impression / Assessment and Plan / UC Course  I have reviewed the triage vital signs and the nursing notes.  Pertinent labs & imaging results that were available during my care of the patient were reviewed  by me and considered in my medical  decision making (see chart for details).  Patient presents for suture removal of 6 sutures from the right ankle.  Suture line is intact, no signs of infection present.  Sutures were removed without difficulty.  Supportive care recommendations were provided and discussed with the patient to include over-the-counter analgesics, continuing to apply Neosporin, and to monitor for worsening symptoms.  Patient was given indications regarding follow-up.  Patient was in agreement with this plan of care and verbalizes understanding.  All questions were answered.  Patient stable for discharge.   Final Clinical Impressions(s) / UC Diagnoses   Final diagnoses:  None   Discharge Instructions   None    ED Prescriptions   None    PDMP not reviewed this encounter.   Gilmer Etta PARAS, NP 08/03/24 1005
